# Patient Record
Sex: Male | Born: 1945 | Race: White | Hispanic: No | Marital: Married | Smoking: Never smoker
Health system: Southern US, Community
[De-identification: ages and names within clinical notes are randomized; demographics above are authoritative.]

## PROBLEM LIST (undated history)

## (undated) DIAGNOSIS — I10 Essential (primary) hypertension: Secondary | ICD-10-CM

## (undated) DIAGNOSIS — E782 Mixed hyperlipidemia: Secondary | ICD-10-CM

## (undated) DIAGNOSIS — G4730 Sleep apnea, unspecified: Secondary | ICD-10-CM

## (undated) DIAGNOSIS — E1142 Type 2 diabetes mellitus with diabetic polyneuropathy: Secondary | ICD-10-CM

## (undated) DIAGNOSIS — G4733 Obstructive sleep apnea (adult) (pediatric): Secondary | ICD-10-CM

## (undated) DIAGNOSIS — E119 Type 2 diabetes mellitus without complications: Secondary | ICD-10-CM

## (undated) DIAGNOSIS — E1122 Type 2 diabetes mellitus with diabetic chronic kidney disease: Secondary | ICD-10-CM

## (undated) HISTORY — PX: FOOT SURGERY: SHX648

## (undated) HISTORY — DX: Obstructive sleep apnea (adult) (pediatric): G47.33

## (undated) HISTORY — DX: Essential (primary) hypertension: I10

## (undated) HISTORY — DX: Type 2 diabetes mellitus with diabetic chronic kidney disease: E11.22

## (undated) HISTORY — PX: KNEE ARTHROPLASTY: SHX992

## (undated) HISTORY — PX: POLYPECTOMY: SHX149

## (undated) HISTORY — PX: SHOULDER ARTHROSCOPY W/ ROTATOR CUFF REPAIR: SHX2400

## (undated) HISTORY — DX: Sleep apnea, unspecified: G47.30

## (undated) HISTORY — DX: Type 2 diabetes mellitus without complications: E11.9

## (undated) HISTORY — PX: COLONOSCOPY: SHX174

## (undated) HISTORY — DX: Mixed hyperlipidemia: E78.2

## (undated) HISTORY — DX: Type 2 diabetes mellitus with diabetic polyneuropathy: E11.42

---

## 1998-02-01 ENCOUNTER — Ambulatory Visit (HOSPITAL_COMMUNITY): Admission: RE | Admit: 1998-02-01 | Discharge: 1998-02-01 | Payer: Self-pay | Admitting: Orthopedic Surgery

## 1998-02-01 ENCOUNTER — Encounter: Payer: Self-pay | Admitting: Orthopedic Surgery

## 1998-03-24 ENCOUNTER — Encounter: Payer: Self-pay | Admitting: Orthopedic Surgery

## 1998-03-30 ENCOUNTER — Inpatient Hospital Stay (HOSPITAL_COMMUNITY): Admission: EM | Admit: 1998-03-30 | Discharge: 1998-03-31 | Payer: Self-pay | Admitting: Orthopedic Surgery

## 1999-09-19 ENCOUNTER — Other Ambulatory Visit: Admission: RE | Admit: 1999-09-19 | Discharge: 1999-09-19 | Payer: Self-pay | Admitting: Gastroenterology

## 1999-09-19 ENCOUNTER — Encounter (INDEPENDENT_AMBULATORY_CARE_PROVIDER_SITE_OTHER): Payer: Self-pay | Admitting: Specialist

## 2008-06-11 ENCOUNTER — Encounter (INDEPENDENT_AMBULATORY_CARE_PROVIDER_SITE_OTHER): Payer: Self-pay | Admitting: *Deleted

## 2008-12-24 ENCOUNTER — Ambulatory Visit: Payer: Self-pay | Admitting: Gastroenterology

## 2009-01-06 ENCOUNTER — Ambulatory Visit: Payer: Self-pay | Admitting: Gastroenterology

## 2010-07-15 LAB — GLUCOSE, CAPILLARY
Glucose-Capillary: 165 mg/dL — ABNORMAL HIGH (ref 70–99)
Glucose-Capillary: 183 mg/dL — ABNORMAL HIGH (ref 70–99)

## 2013-09-19 ENCOUNTER — Other Ambulatory Visit: Payer: Self-pay | Admitting: Internal Medicine

## 2013-09-19 DIAGNOSIS — R221 Localized swelling, mass and lump, neck: Secondary | ICD-10-CM

## 2013-09-30 ENCOUNTER — Encounter (INDEPENDENT_AMBULATORY_CARE_PROVIDER_SITE_OTHER): Payer: Self-pay

## 2013-09-30 ENCOUNTER — Ambulatory Visit
Admission: RE | Admit: 2013-09-30 | Discharge: 2013-09-30 | Disposition: A | Discharge status: Home / Self Care | Payer: Medicare Other | Source: Ambulatory Visit | Attending: Internal Medicine | Admitting: Internal Medicine

## 2013-09-30 DIAGNOSIS — R221 Localized swelling, mass and lump, neck: Secondary | ICD-10-CM

## 2013-11-27 ENCOUNTER — Encounter: Payer: Self-pay | Admitting: Gastroenterology

## 2013-12-12 ENCOUNTER — Encounter: Payer: Self-pay | Admitting: Gastroenterology

## 2014-07-13 DIAGNOSIS — Z1211 Encounter for screening for malignant neoplasm of colon: Secondary | ICD-10-CM | POA: Diagnosis not present

## 2014-07-13 DIAGNOSIS — E119 Type 2 diabetes mellitus without complications: Secondary | ICD-10-CM | POA: Diagnosis not present

## 2014-07-13 DIAGNOSIS — Z0000 Encounter for general adult medical examination without abnormal findings: Secondary | ICD-10-CM | POA: Diagnosis not present

## 2014-07-14 DIAGNOSIS — E119 Type 2 diabetes mellitus without complications: Secondary | ICD-10-CM | POA: Diagnosis not present

## 2014-07-14 DIAGNOSIS — Z125 Encounter for screening for malignant neoplasm of prostate: Secondary | ICD-10-CM | POA: Diagnosis not present

## 2014-07-14 DIAGNOSIS — Z0000 Encounter for general adult medical examination without abnormal findings: Secondary | ICD-10-CM | POA: Diagnosis not present

## 2014-07-14 DIAGNOSIS — E782 Mixed hyperlipidemia: Secondary | ICD-10-CM | POA: Diagnosis not present

## 2014-07-14 DIAGNOSIS — I10 Essential (primary) hypertension: Secondary | ICD-10-CM | POA: Diagnosis not present

## 2014-07-22 DIAGNOSIS — I70213 Atherosclerosis of native arteries of extremities with intermittent claudication, bilateral legs: Secondary | ICD-10-CM | POA: Diagnosis not present

## 2014-07-22 DIAGNOSIS — N508 Other specified disorders of male genital organs: Secondary | ICD-10-CM | POA: Diagnosis not present

## 2014-07-22 DIAGNOSIS — I7389 Other specified peripheral vascular diseases: Secondary | ICD-10-CM | POA: Diagnosis not present

## 2014-07-22 DIAGNOSIS — N433 Hydrocele, unspecified: Secondary | ICD-10-CM | POA: Diagnosis not present

## 2014-07-22 DIAGNOSIS — I7090 Unspecified atherosclerosis: Secondary | ICD-10-CM | POA: Diagnosis not present

## 2014-07-31 DIAGNOSIS — H524 Presbyopia: Secondary | ICD-10-CM | POA: Diagnosis not present

## 2014-07-31 DIAGNOSIS — H5213 Myopia, bilateral: Secondary | ICD-10-CM | POA: Diagnosis not present

## 2014-07-31 DIAGNOSIS — H25813 Combined forms of age-related cataract, bilateral: Secondary | ICD-10-CM | POA: Diagnosis not present

## 2014-07-31 DIAGNOSIS — H52223 Regular astigmatism, bilateral: Secondary | ICD-10-CM | POA: Diagnosis not present

## 2014-07-31 DIAGNOSIS — E119 Type 2 diabetes mellitus without complications: Secondary | ICD-10-CM | POA: Diagnosis not present

## 2014-07-31 DIAGNOSIS — Z794 Long term (current) use of insulin: Secondary | ICD-10-CM | POA: Diagnosis not present

## 2014-09-28 DIAGNOSIS — Z794 Long term (current) use of insulin: Secondary | ICD-10-CM | POA: Diagnosis not present

## 2014-09-28 DIAGNOSIS — E1142 Type 2 diabetes mellitus with diabetic polyneuropathy: Secondary | ICD-10-CM | POA: Diagnosis not present

## 2014-09-30 DIAGNOSIS — E1142 Type 2 diabetes mellitus with diabetic polyneuropathy: Secondary | ICD-10-CM | POA: Diagnosis not present

## 2014-09-30 DIAGNOSIS — Z794 Long term (current) use of insulin: Secondary | ICD-10-CM | POA: Diagnosis not present

## 2014-09-30 DIAGNOSIS — N182 Chronic kidney disease, stage 2 (mild): Secondary | ICD-10-CM | POA: Diagnosis not present

## 2014-09-30 DIAGNOSIS — Z23 Encounter for immunization: Secondary | ICD-10-CM | POA: Diagnosis not present

## 2014-09-30 DIAGNOSIS — E1122 Type 2 diabetes mellitus with diabetic chronic kidney disease: Secondary | ICD-10-CM | POA: Diagnosis not present

## 2014-11-02 ENCOUNTER — Ambulatory Visit (INDEPENDENT_AMBULATORY_CARE_PROVIDER_SITE_OTHER): Payer: Commercial Managed Care - HMO | Admitting: Podiatry

## 2014-11-02 ENCOUNTER — Encounter: Payer: Self-pay | Admitting: Podiatry

## 2014-11-02 VITALS — BP 139/66 | HR 71 | Resp 18

## 2014-11-02 DIAGNOSIS — B351 Tinea unguium: Secondary | ICD-10-CM | POA: Diagnosis not present

## 2014-11-02 DIAGNOSIS — E119 Type 2 diabetes mellitus without complications: Secondary | ICD-10-CM

## 2014-11-02 DIAGNOSIS — M79676 Pain in unspecified toe(s): Secondary | ICD-10-CM | POA: Diagnosis not present

## 2014-11-02 NOTE — Progress Notes (Signed)
   Subjective:    Patient ID: Johnathan Wall, male    DOB: 06-01-1945, 69 y.o.   MRN: [id number]  HPI I HAVE DIABETES AND I NEED MY FEET CHECKED AND I DO HAVE A CYST THAT IS ON THE TOP OF MY LEFT FOOT AND I DID HAVE SURGERY ON IT YEARS AGO This patient presents to my office for evaluation of his diabetic feet.  He says his nails need to be checked.  He has been diagnosed with type 2 diabetes.  He desires to be evaluated for footwear.   Review of Systems  Musculoskeletal: Positive for back pain.       MUSCLE PAIN   All other systems reviewed and are negative.      Objective:   Physical Exam GENERAL APPEARANCE: Alert, conversant. Appropriately groomed. No acute distress.  VASCULAR: Pedal pulses palpable at 2/4 DP and PT bilateral.  Capillary refill time is immediate to all digits,  Proximal to distal cooling it warm to warm.  Digital hair growth is present bilateral  NEUROLOGIC: sensation is intact epicritically and protectively to 5.07 monofilament at 5/5 sites bilateral.  Light touch is intact bilateral, vibratory sensation intact bilateral, achilles tendon reflex is intact bilateral.  MUSCULOSKELETAL: acceptable muscle strength, tone and stability bilateral.  Intrinsic muscluature intact bilateral.  Rectus appearance of foot and digits noted bilateral.   DERMATOLOGIC: skin color, texture, and turgor are within normal limits.  No preulcerative lesions or ulcers  are seen, no interdigital maceration noted.  No open lesions present.  Digital nails are thick and disfigured big toes both feet.. No drainage noted. Blisters form along tight plantar fascia B/L.           Assessment & Plan:  Onychomycosis   Debride nails.  Discussed orthotic usage.  His diabetic foot exam were all found to be WNL therefore I believe he does not need diabetic shoes.

## 2015-01-11 DIAGNOSIS — Z23 Encounter for immunization: Secondary | ICD-10-CM | POA: Diagnosis not present

## 2015-02-15 ENCOUNTER — Encounter: Payer: Self-pay | Admitting: Podiatry

## 2015-02-15 ENCOUNTER — Ambulatory Visit (INDEPENDENT_AMBULATORY_CARE_PROVIDER_SITE_OTHER): Payer: Commercial Managed Care - HMO | Admitting: Podiatry

## 2015-02-15 ENCOUNTER — Ambulatory Visit (INDEPENDENT_AMBULATORY_CARE_PROVIDER_SITE_OTHER): Payer: Commercial Managed Care - HMO

## 2015-02-15 VITALS — BP 158/71 | HR 89 | Resp 14

## 2015-02-15 DIAGNOSIS — R52 Pain, unspecified: Secondary | ICD-10-CM

## 2015-02-15 DIAGNOSIS — E1140 Type 2 diabetes mellitus with diabetic neuropathy, unspecified: Secondary | ICD-10-CM

## 2015-02-15 DIAGNOSIS — M722 Plantar fascial fibromatosis: Secondary | ICD-10-CM | POA: Diagnosis not present

## 2015-02-15 MED ORDER — MELOXICAM 15 MG PO TABS: 15.0000 mg | ORAL_TABLET | Freq: Every day | ORAL | Status: DC

## 2015-02-15 NOTE — Progress Notes (Signed)
Subjective:     Patient ID: Johnathan Wall, male   DOB: 1945-04-25, 69 y.o.   MRN: [id number]  HPIThis patient presents to the office with severe pain in his left heel.  He says he developed severe pain about three weeks ago.  No trauma.  He says he used a brace for the last 2 weeks and his foot has improved and his pain lessened.  He says the pain is still 6 out of 10. He has pain upon rising in the morning and standing from sitting position.  Pain is severe by end of the day.  He desires evaluation and treatment.   Review of Systems     Objective:   Physical Exam GENERAL APPEARANCE: Alert, conversant. Appropriately groomed. No acute distress.  VASCULAR: Pedal pulses palpable at  [facility] and PT bilateral.  Capillary refill time is immediate to all digits,  Normal temperature gradient.  Digital hair growth is present bilateral  NEUROLOGIC: sensation is normal to 5.07 monofilament at 5/5 sites bilateral.  Light touch is intact bilateral, Muscle strength normal.  MUSCULOSKELETAL: acceptable muscle strength, tone and stability bilateral.  Intrinsic muscluature intact bilateral.  Rectus appearance of foot and digits noted bilateral. Palpable pain at the insertion plantar fascia left foot. Pain along course plantar fascia. HAV 1st MPJ left foot.  DERMATOLOGIC: skin color, texture, and turgor are within normal limits.  No preulcerative lesions or ulcers  are seen, no interdigital maceration noted.  No open lesions present.  Digital nails are asymptomatic. No drainage noted.      Assessment:     Plantar fascitis left foot.  Diabetes with neuropathy.     Plan:     ROV>  X-rays reveal calcification at the insertion plantar fascia B/L. Recommended stretching exercises.  To use ice and heat.  Prescribe Mobic.  Recommend powersteps.    Injection therapy.  RTC 2 weeks. Initiate diabetic shoe paperwork.

## 2015-02-15 NOTE — Addendum Note (Signed)
Addended by: Harriett Sine D on: 02/15/2015 01:13 PM   Modules accepted: Orders

## 2015-03-01 ENCOUNTER — Encounter: Payer: Self-pay | Admitting: Podiatry

## 2015-03-01 ENCOUNTER — Ambulatory Visit (INDEPENDENT_AMBULATORY_CARE_PROVIDER_SITE_OTHER): Payer: Commercial Managed Care - HMO | Admitting: Podiatry

## 2015-03-01 VITALS — BP 181/84 | HR 86 | Resp 14

## 2015-03-01 DIAGNOSIS — M722 Plantar fascial fibromatosis: Secondary | ICD-10-CM

## 2015-03-01 NOTE — Progress Notes (Signed)
Subjective:     Patient ID: Johnathan Wall, male   DOB: 1945/06/22, 69 y.o.   MRN: [id number]  HPIThpatient returns to the office with marked improvement since his last visit.  He was diagnosed with plantar faciitis  which was treated with injection therapy Mobic and powersteps.  He says he is 80% imptoved.  Pain persists though at back of left heel at this time.   Review of Systems     Objective:   Physical Exam Physical Exam GENERAL APPEARANCE: Alert, conversant. Appropriately groomed. No acute distress.  VASCULAR: Pedal pulses palpable at [facility] and PT bilateral. Capillary refill time is immediate to all digits, Normal temperature gradient. Digital hair growth is present bilateral  NEUROLOGIC: sensation is normal to 5.07 monofilament at 5/5 sites bilateral. Light touch is intact bilateral, Muscle strength normal.  MUSCULOSKELETAL: acceptable muscle strength, tone and stability bilateral. Intrinsic muscluature intact bilateral. Rectus appearance of foot and digits noted bilateral. No pain at insertion plantar fascia insertion.  Pain and burning noted at achilles tendon insertion left foot.  DERMATOLOGIC: skin color, texture, and turgor are within normal limits. No preulcerative lesions or ulcers are seen, no interdigital maceration noted. No open lesions present. Digital nails are asymptomatic. No drainage noted    Assessment:     Plantar fasciitis left heel  Achilles tendinitis     Plan:     ROV  Continue on medicine and wearing powersteps.  He is scheduled to receive diabetic shoes this month.

## 2015-04-05 ENCOUNTER — Ambulatory Visit: Payer: Commercial Managed Care - HMO | Admitting: *Deleted

## 2015-04-05 DIAGNOSIS — E1140 Type 2 diabetes mellitus with diabetic neuropathy, unspecified: Secondary | ICD-10-CM

## 2015-04-05 NOTE — Progress Notes (Signed)
Patient ID: Johnathan Wall, male   DOB: 1946-01-08, 69 y.o.   MRN: [id number] Patient presents to be scanned and measured for diabetic shoes and inserts.

## 2015-04-14 DIAGNOSIS — E1122 Type 2 diabetes mellitus with diabetic chronic kidney disease: Secondary | ICD-10-CM | POA: Diagnosis not present

## 2015-04-14 DIAGNOSIS — Z794 Long term (current) use of insulin: Secondary | ICD-10-CM | POA: Diagnosis not present

## 2015-04-14 DIAGNOSIS — E1142 Type 2 diabetes mellitus with diabetic polyneuropathy: Secondary | ICD-10-CM | POA: Diagnosis not present

## 2015-04-14 DIAGNOSIS — N182 Chronic kidney disease, stage 2 (mild): Secondary | ICD-10-CM | POA: Diagnosis not present

## 2015-05-19 ENCOUNTER — Encounter: Payer: Commercial Managed Care - HMO | Admitting: *Deleted

## 2015-06-08 ENCOUNTER — Other Ambulatory Visit: Payer: Commercial Managed Care - HMO

## 2015-06-16 ENCOUNTER — Other Ambulatory Visit: Payer: Commercial Managed Care - HMO

## 2015-07-05 ENCOUNTER — Ambulatory Visit (INDEPENDENT_AMBULATORY_CARE_PROVIDER_SITE_OTHER): Payer: Commercial Managed Care - HMO | Admitting: Podiatry

## 2015-07-05 ENCOUNTER — Encounter: Payer: Self-pay | Admitting: Podiatry

## 2015-07-05 VITALS — BP 147/83 | HR 92 | Resp 14

## 2015-07-05 DIAGNOSIS — M722 Plantar fascial fibromatosis: Secondary | ICD-10-CM

## 2015-07-05 DIAGNOSIS — E1140 Type 2 diabetes mellitus with diabetic neuropathy, unspecified: Secondary | ICD-10-CM

## 2015-07-05 NOTE — Progress Notes (Signed)
Subjective:     Patient ID: Johnathan Wall, male   DOB: 1946/03/31, 70 y.o.   MRN: [id number]  HPI this patient returns to the office for an evaluation of his foot as well as his diabetic shoes that were dispensed in December. This patient presents the office stating that he is in no pain or discomfort ever since the diabetic insoles were contoured and fit to the shoe. He is very pleased with his progress   Review of Systems     Objective:   Physical Exam GENERAL APPEARANCE: Alert, conversant. Appropriately groomed. No acute distress.  VASCULAR: Pedal pulses are  palpable at  [facility] and PT bilateral.  Capillary refill time is immediate to all digits,  Normal temperature gradient.  Digital hair growth is present bilateral  NEUROLOGIC: sensation is normal to 5.07 monofilament at 5/5 sites bilateral.  Light touch is intact bilateral, Muscle strength normal.  MUSCULOSKELETAL  A cceptable muscle strength, tone and stability bilateral.  Intrinsic muscluature intact bilateral.  Rectus appearance of foot and digits noted bilateral. No pain in heel or achilles region.   DERMATOLOGIC: skin color, texture, and turgor are within normal limits.  No preulcerative lesions or ulcers  are seen, no interdigital maceration noted.  No open lesions present.  Digital nails are asymptomatic. No drainage noted.      Assessment:     Plantar Facitis  Achilles tendinitis  Diabetes     Plan:     ROV.   Discussed his footwear.  Told to RTC prn.  Gardiner Barefoot DPM

## 2015-07-14 DIAGNOSIS — N200 Calculus of kidney: Secondary | ICD-10-CM | POA: Diagnosis not present

## 2015-07-14 DIAGNOSIS — R1031 Right lower quadrant pain: Secondary | ICD-10-CM | POA: Diagnosis not present

## 2015-07-14 DIAGNOSIS — R509 Fever, unspecified: Secondary | ICD-10-CM | POA: Diagnosis not present

## 2015-07-14 DIAGNOSIS — R319 Hematuria, unspecified: Secondary | ICD-10-CM | POA: Diagnosis not present

## 2015-07-14 DIAGNOSIS — N390 Urinary tract infection, site not specified: Secondary | ICD-10-CM | POA: Diagnosis not present

## 2015-08-15 IMAGING — US US SOFT TISSUE HEAD/NECK
1 series · 10 of 10 positions shown · non-contrast
Comparison: None.

CLINICAL DATA: Palpable area in the posterior right neck

EXAM:
ULTRASOUND OF HEAD/NECK SOFT TISSUES
TECHNIQUE: Ultrasound examination of the head and neck soft tissues was
performed in the area of clinical concern.

[Series 1: us soft tissue head/neck · 0.05mm/px · 10 of 10 slices shown]
[im 1/10]
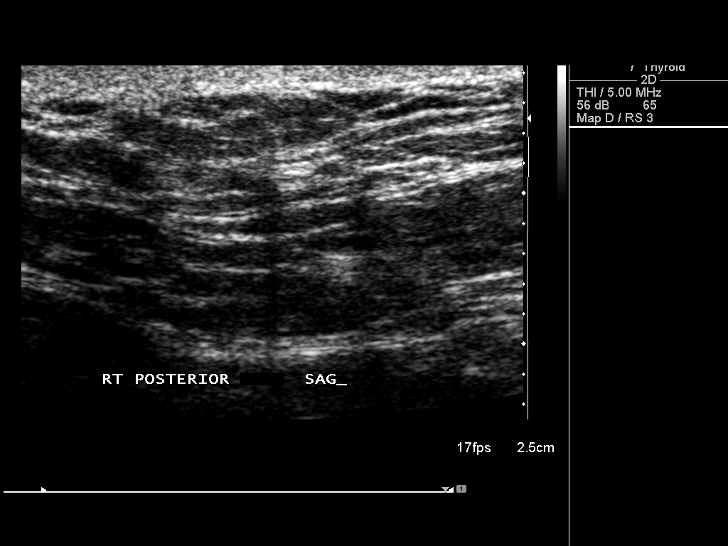
[im 2/10]
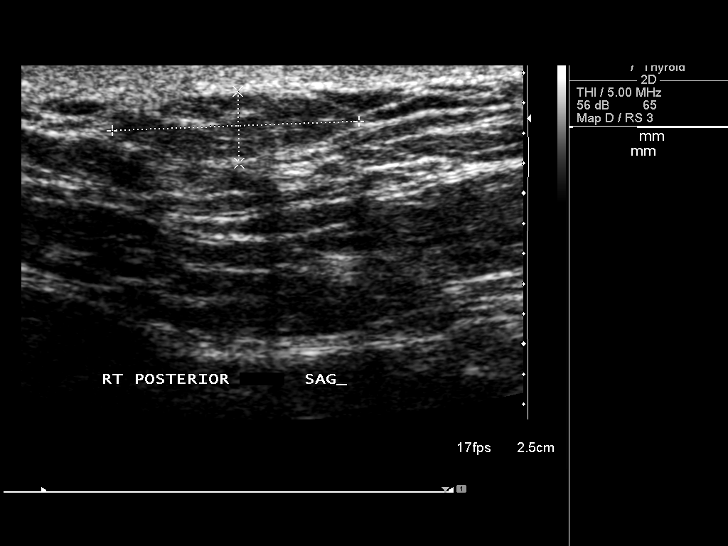
[im 3/10]
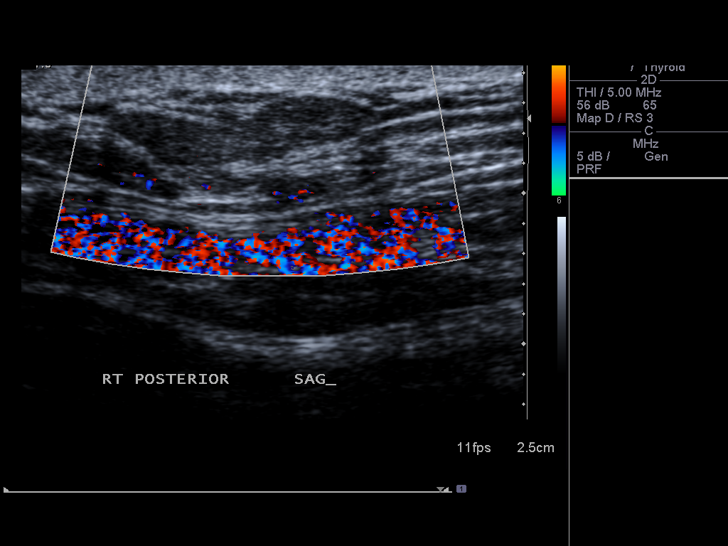
[im 4/10]
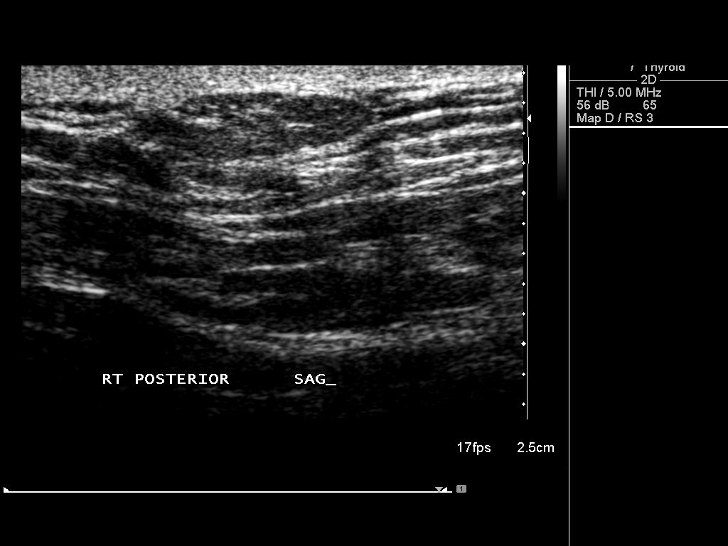
[im 5/10]
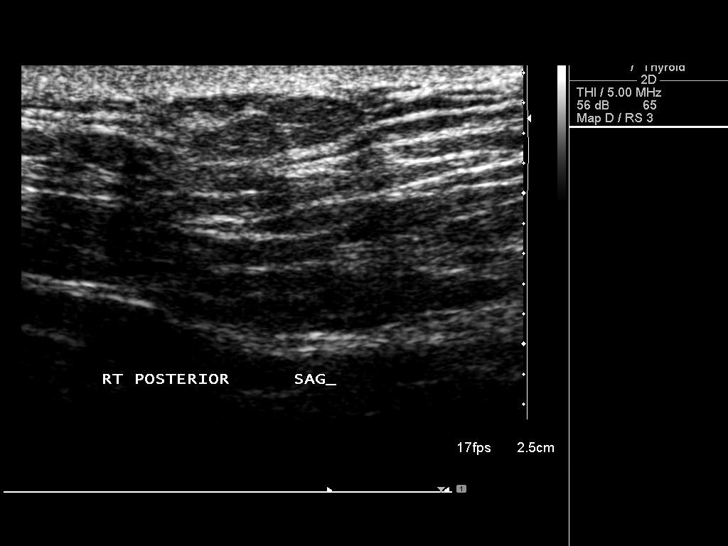
[im 6/10]
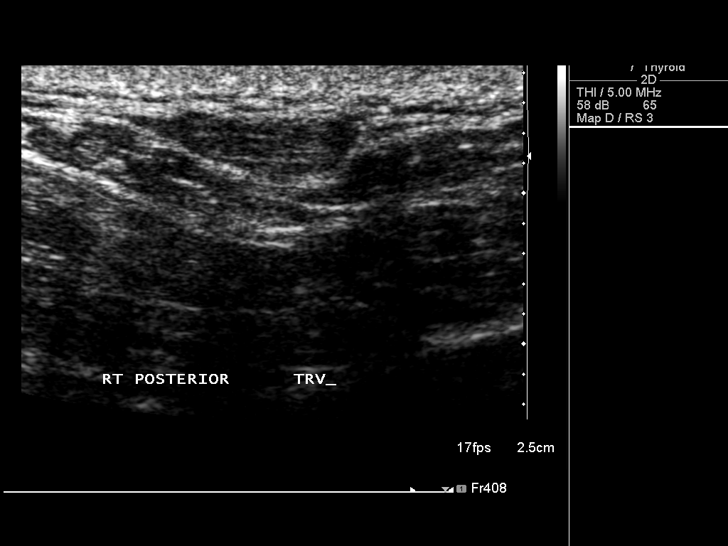
[im 7/10]
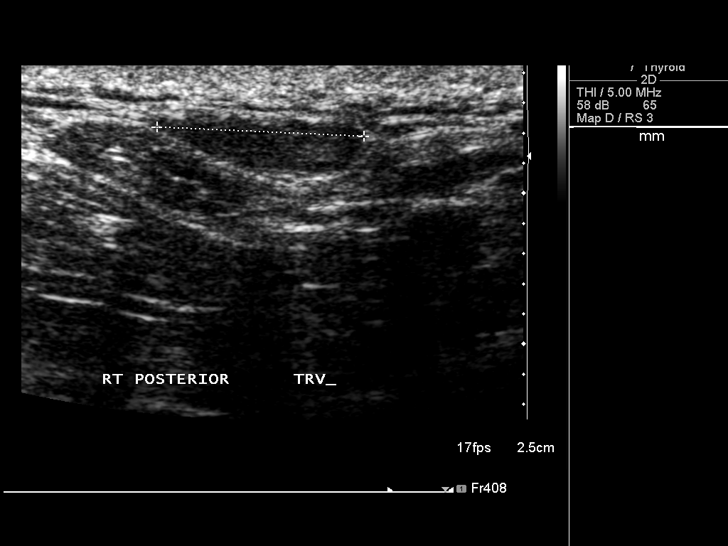
[im 8/10]
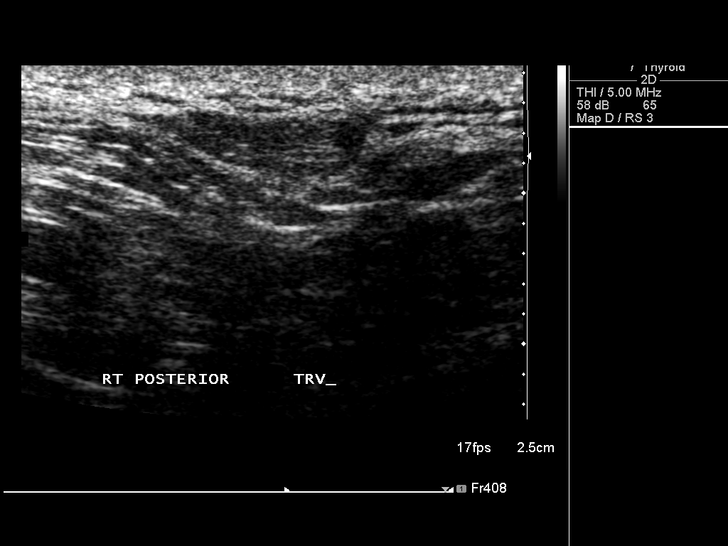
[im 9/10]
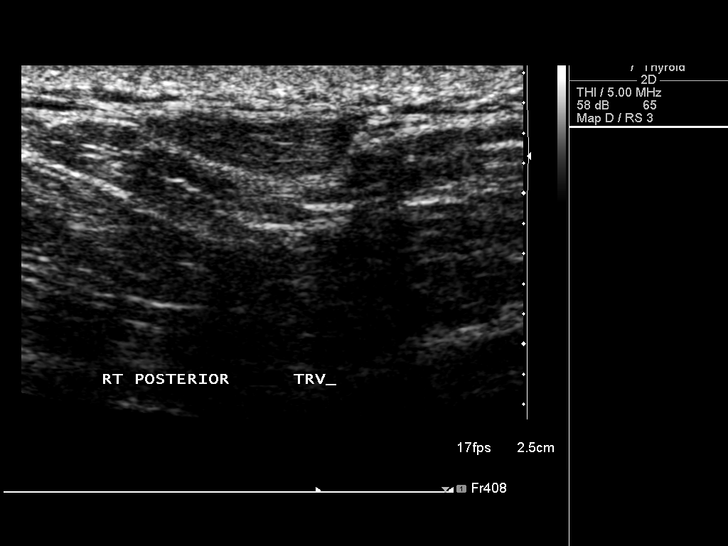
[im 10/10]
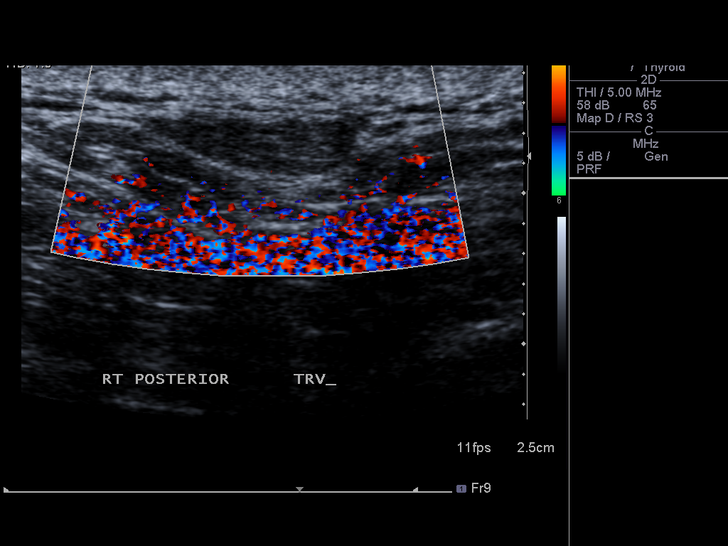

[10 of 10 positions shown; findings below may reference images not displayed]

FINDINGS: Ultrasound over the area in question was performed. The palpable
area corresponds to an oval soft tissue structure with echogenicity
centrally consistent with lymph node of 1.6 x 0.5 x 1.4 cm. No
abnormally enlarged lymph nodes are noted.
IMPRESSION: The area in question corresponds to a normal appearing lymph node by
ultrasound.

## 2015-10-19 DIAGNOSIS — Z794 Long term (current) use of insulin: Secondary | ICD-10-CM | POA: Diagnosis not present

## 2015-10-19 DIAGNOSIS — E1122 Type 2 diabetes mellitus with diabetic chronic kidney disease: Secondary | ICD-10-CM | POA: Diagnosis not present

## 2015-10-19 DIAGNOSIS — N182 Chronic kidney disease, stage 2 (mild): Secondary | ICD-10-CM | POA: Diagnosis not present

## 2015-10-19 DIAGNOSIS — Z5181 Encounter for therapeutic drug level monitoring: Secondary | ICD-10-CM | POA: Diagnosis not present

## 2015-10-19 DIAGNOSIS — E1142 Type 2 diabetes mellitus with diabetic polyneuropathy: Secondary | ICD-10-CM | POA: Diagnosis not present

## 2015-11-01 ENCOUNTER — Ambulatory Visit: Payer: Medicare HMO | Admitting: Podiatry

## 2015-11-01 DIAGNOSIS — Z7984 Long term (current) use of oral hypoglycemic drugs: Secondary | ICD-10-CM | POA: Diagnosis not present

## 2015-11-01 DIAGNOSIS — Z794 Long term (current) use of insulin: Secondary | ICD-10-CM | POA: Diagnosis not present

## 2015-11-01 DIAGNOSIS — E1142 Type 2 diabetes mellitus with diabetic polyneuropathy: Secondary | ICD-10-CM | POA: Diagnosis not present

## 2015-11-01 DIAGNOSIS — Z79899 Other long term (current) drug therapy: Secondary | ICD-10-CM | POA: Diagnosis not present

## 2015-11-08 DIAGNOSIS — Z794 Long term (current) use of insulin: Secondary | ICD-10-CM | POA: Diagnosis not present

## 2015-11-08 DIAGNOSIS — E1142 Type 2 diabetes mellitus with diabetic polyneuropathy: Secondary | ICD-10-CM | POA: Diagnosis not present

## 2015-11-19 DIAGNOSIS — E538 Deficiency of other specified B group vitamins: Secondary | ICD-10-CM | POA: Diagnosis not present

## 2015-11-26 DIAGNOSIS — E538 Deficiency of other specified B group vitamins: Secondary | ICD-10-CM | POA: Diagnosis not present

## 2015-12-03 DIAGNOSIS — E538 Deficiency of other specified B group vitamins: Secondary | ICD-10-CM | POA: Diagnosis not present

## 2015-12-06 ENCOUNTER — Other Ambulatory Visit: Payer: Self-pay | Admitting: Internal Medicine

## 2015-12-06 DIAGNOSIS — R6889 Other general symptoms and signs: Secondary | ICD-10-CM

## 2015-12-14 ENCOUNTER — Ambulatory Visit
Admission: RE | Admit: 2015-12-14 | Discharge: 2015-12-14 | Disposition: A | Discharge status: Home / Self Care | Payer: Commercial Managed Care - HMO | Source: Ambulatory Visit | Attending: Internal Medicine | Admitting: Internal Medicine

## 2015-12-14 DIAGNOSIS — M79604 Pain in right leg: Secondary | ICD-10-CM | POA: Diagnosis not present

## 2015-12-14 DIAGNOSIS — R6889 Other general symptoms and signs: Secondary | ICD-10-CM

## 2016-01-04 DIAGNOSIS — Z23 Encounter for immunization: Secondary | ICD-10-CM | POA: Diagnosis not present

## 2016-01-04 DIAGNOSIS — E538 Deficiency of other specified B group vitamins: Secondary | ICD-10-CM | POA: Diagnosis not present

## 2016-01-26 DIAGNOSIS — G4733 Obstructive sleep apnea (adult) (pediatric): Secondary | ICD-10-CM | POA: Diagnosis not present

## 2016-01-26 DIAGNOSIS — Z125 Encounter for screening for malignant neoplasm of prostate: Secondary | ICD-10-CM | POA: Diagnosis not present

## 2016-01-27 DIAGNOSIS — G4733 Obstructive sleep apnea (adult) (pediatric): Secondary | ICD-10-CM | POA: Diagnosis not present

## 2016-01-31 DIAGNOSIS — G4733 Obstructive sleep apnea (adult) (pediatric): Secondary | ICD-10-CM | POA: Diagnosis not present

## 2016-02-04 DIAGNOSIS — E1122 Type 2 diabetes mellitus with diabetic chronic kidney disease: Secondary | ICD-10-CM | POA: Diagnosis not present

## 2016-02-04 DIAGNOSIS — N182 Chronic kidney disease, stage 2 (mild): Secondary | ICD-10-CM | POA: Diagnosis not present

## 2016-02-04 DIAGNOSIS — Z5181 Encounter for therapeutic drug level monitoring: Secondary | ICD-10-CM | POA: Diagnosis not present

## 2016-02-04 DIAGNOSIS — E1142 Type 2 diabetes mellitus with diabetic polyneuropathy: Secondary | ICD-10-CM | POA: Diagnosis not present

## 2016-02-04 DIAGNOSIS — E538 Deficiency of other specified B group vitamins: Secondary | ICD-10-CM | POA: Diagnosis not present

## 2016-02-04 DIAGNOSIS — Z794 Long term (current) use of insulin: Secondary | ICD-10-CM | POA: Diagnosis not present

## 2016-03-07 DIAGNOSIS — Z794 Long term (current) use of insulin: Secondary | ICD-10-CM | POA: Diagnosis not present

## 2016-03-07 DIAGNOSIS — E1142 Type 2 diabetes mellitus with diabetic polyneuropathy: Secondary | ICD-10-CM | POA: Diagnosis not present

## 2016-04-14 DIAGNOSIS — E1122 Type 2 diabetes mellitus with diabetic chronic kidney disease: Secondary | ICD-10-CM | POA: Diagnosis not present

## 2016-04-14 DIAGNOSIS — E1142 Type 2 diabetes mellitus with diabetic polyneuropathy: Secondary | ICD-10-CM | POA: Diagnosis not present

## 2016-04-14 DIAGNOSIS — Z5181 Encounter for therapeutic drug level monitoring: Secondary | ICD-10-CM | POA: Diagnosis not present

## 2016-04-14 DIAGNOSIS — E669 Obesity, unspecified: Secondary | ICD-10-CM | POA: Diagnosis not present

## 2016-04-14 DIAGNOSIS — Z794 Long term (current) use of insulin: Secondary | ICD-10-CM | POA: Diagnosis not present

## 2016-04-14 DIAGNOSIS — Z6839 Body mass index (BMI) 39.0-39.9, adult: Secondary | ICD-10-CM | POA: Diagnosis not present

## 2016-04-14 DIAGNOSIS — N182 Chronic kidney disease, stage 2 (mild): Secondary | ICD-10-CM | POA: Diagnosis not present

## 2016-04-14 DIAGNOSIS — E538 Deficiency of other specified B group vitamins: Secondary | ICD-10-CM | POA: Diagnosis not present

## 2016-05-03 DIAGNOSIS — G4733 Obstructive sleep apnea (adult) (pediatric): Secondary | ICD-10-CM | POA: Diagnosis not present

## 2016-05-17 DIAGNOSIS — E538 Deficiency of other specified B group vitamins: Secondary | ICD-10-CM | POA: Diagnosis not present

## 2016-05-23 DIAGNOSIS — E109 Type 1 diabetes mellitus without complications: Secondary | ICD-10-CM | POA: Diagnosis not present

## 2016-06-15 DIAGNOSIS — E538 Deficiency of other specified B group vitamins: Secondary | ICD-10-CM | POA: Diagnosis not present

## 2016-06-22 DIAGNOSIS — M25561 Pain in right knee: Secondary | ICD-10-CM | POA: Diagnosis not present

## 2016-06-22 DIAGNOSIS — M25512 Pain in left shoulder: Secondary | ICD-10-CM | POA: Diagnosis not present

## 2016-06-22 DIAGNOSIS — G8929 Other chronic pain: Secondary | ICD-10-CM | POA: Diagnosis not present

## 2016-06-26 DIAGNOSIS — M24112 Other articular cartilage disorders, left shoulder: Secondary | ICD-10-CM | POA: Diagnosis not present

## 2016-06-26 DIAGNOSIS — M19012 Primary osteoarthritis, left shoulder: Secondary | ICD-10-CM | POA: Diagnosis not present

## 2016-06-26 DIAGNOSIS — M24012 Loose body in left shoulder: Secondary | ICD-10-CM | POA: Diagnosis not present

## 2016-06-26 DIAGNOSIS — M94212 Chondromalacia, left shoulder: Secondary | ICD-10-CM | POA: Diagnosis not present

## 2016-06-26 DIAGNOSIS — M25412 Effusion, left shoulder: Secondary | ICD-10-CM | POA: Diagnosis not present

## 2016-06-26 DIAGNOSIS — M75122 Complete rotator cuff tear or rupture of left shoulder, not specified as traumatic: Secondary | ICD-10-CM | POA: Diagnosis not present

## 2016-07-01 DIAGNOSIS — M25561 Pain in right knee: Secondary | ICD-10-CM | POA: Diagnosis not present

## 2016-07-01 DIAGNOSIS — G8929 Other chronic pain: Secondary | ICD-10-CM | POA: Diagnosis not present

## 2016-07-01 DIAGNOSIS — M25512 Pain in left shoulder: Secondary | ICD-10-CM | POA: Diagnosis not present

## 2016-07-06 DIAGNOSIS — M25512 Pain in left shoulder: Secondary | ICD-10-CM | POA: Diagnosis not present

## 2016-07-17 DIAGNOSIS — E538 Deficiency of other specified B group vitamins: Secondary | ICD-10-CM | POA: Diagnosis not present

## 2016-07-27 DIAGNOSIS — M7552 Bursitis of left shoulder: Secondary | ICD-10-CM | POA: Diagnosis not present

## 2016-07-27 DIAGNOSIS — R69 Illness, unspecified: Secondary | ICD-10-CM | POA: Diagnosis not present

## 2016-07-27 DIAGNOSIS — G8918 Other acute postprocedural pain: Secondary | ICD-10-CM | POA: Diagnosis not present

## 2016-07-27 DIAGNOSIS — M7542 Impingement syndrome of left shoulder: Secondary | ICD-10-CM | POA: Diagnosis not present

## 2016-07-27 DIAGNOSIS — M19012 Primary osteoarthritis, left shoulder: Secondary | ICD-10-CM | POA: Diagnosis not present

## 2016-07-27 DIAGNOSIS — M75122 Complete rotator cuff tear or rupture of left shoulder, not specified as traumatic: Secondary | ICD-10-CM | POA: Diagnosis not present

## 2016-09-05 DIAGNOSIS — M25612 Stiffness of left shoulder, not elsewhere classified: Secondary | ICD-10-CM | POA: Diagnosis not present

## 2016-09-08 DIAGNOSIS — M25612 Stiffness of left shoulder, not elsewhere classified: Secondary | ICD-10-CM | POA: Diagnosis not present

## 2016-09-11 DIAGNOSIS — E1142 Type 2 diabetes mellitus with diabetic polyneuropathy: Secondary | ICD-10-CM | POA: Diagnosis not present

## 2016-09-11 DIAGNOSIS — E1122 Type 2 diabetes mellitus with diabetic chronic kidney disease: Secondary | ICD-10-CM | POA: Diagnosis not present

## 2016-09-11 DIAGNOSIS — E538 Deficiency of other specified B group vitamins: Secondary | ICD-10-CM | POA: Diagnosis not present

## 2016-09-11 DIAGNOSIS — E669 Obesity, unspecified: Secondary | ICD-10-CM | POA: Diagnosis not present

## 2016-09-11 DIAGNOSIS — N182 Chronic kidney disease, stage 2 (mild): Secondary | ICD-10-CM | POA: Diagnosis not present

## 2016-09-11 DIAGNOSIS — Z794 Long term (current) use of insulin: Secondary | ICD-10-CM | POA: Diagnosis not present

## 2016-09-11 DIAGNOSIS — Z6839 Body mass index (BMI) 39.0-39.9, adult: Secondary | ICD-10-CM | POA: Diagnosis not present

## 2016-09-11 DIAGNOSIS — Z5181 Encounter for therapeutic drug level monitoring: Secondary | ICD-10-CM | POA: Diagnosis not present

## 2016-09-15 DIAGNOSIS — M25612 Stiffness of left shoulder, not elsewhere classified: Secondary | ICD-10-CM | POA: Diagnosis not present

## 2016-09-19 DIAGNOSIS — M25612 Stiffness of left shoulder, not elsewhere classified: Secondary | ICD-10-CM | POA: Diagnosis not present

## 2016-09-22 DIAGNOSIS — M25612 Stiffness of left shoulder, not elsewhere classified: Secondary | ICD-10-CM | POA: Diagnosis not present

## 2016-09-26 DIAGNOSIS — M25612 Stiffness of left shoulder, not elsewhere classified: Secondary | ICD-10-CM | POA: Diagnosis not present

## 2016-09-29 DIAGNOSIS — M25612 Stiffness of left shoulder, not elsewhere classified: Secondary | ICD-10-CM | POA: Diagnosis not present

## 2016-10-03 DIAGNOSIS — M25612 Stiffness of left shoulder, not elsewhere classified: Secondary | ICD-10-CM | POA: Diagnosis not present

## 2016-10-06 DIAGNOSIS — M25612 Stiffness of left shoulder, not elsewhere classified: Secondary | ICD-10-CM | POA: Diagnosis not present

## 2016-10-17 DIAGNOSIS — M25612 Stiffness of left shoulder, not elsewhere classified: Secondary | ICD-10-CM | POA: Diagnosis not present

## 2016-10-20 DIAGNOSIS — M25612 Stiffness of left shoulder, not elsewhere classified: Secondary | ICD-10-CM | POA: Diagnosis not present

## 2016-10-20 DIAGNOSIS — E538 Deficiency of other specified B group vitamins: Secondary | ICD-10-CM | POA: Diagnosis not present

## 2016-10-24 DIAGNOSIS — M25612 Stiffness of left shoulder, not elsewhere classified: Secondary | ICD-10-CM | POA: Diagnosis not present

## 2016-10-27 DIAGNOSIS — M25612 Stiffness of left shoulder, not elsewhere classified: Secondary | ICD-10-CM | POA: Diagnosis not present

## 2016-10-30 DIAGNOSIS — M25612 Stiffness of left shoulder, not elsewhere classified: Secondary | ICD-10-CM | POA: Diagnosis not present

## 2016-11-02 DIAGNOSIS — M25612 Stiffness of left shoulder, not elsewhere classified: Secondary | ICD-10-CM | POA: Diagnosis not present

## 2016-11-06 DIAGNOSIS — M25612 Stiffness of left shoulder, not elsewhere classified: Secondary | ICD-10-CM | POA: Diagnosis not present

## 2016-11-14 DIAGNOSIS — M25612 Stiffness of left shoulder, not elsewhere classified: Secondary | ICD-10-CM | POA: Diagnosis not present

## 2016-11-17 DIAGNOSIS — M25612 Stiffness of left shoulder, not elsewhere classified: Secondary | ICD-10-CM | POA: Diagnosis not present

## 2016-11-17 DIAGNOSIS — G4733 Obstructive sleep apnea (adult) (pediatric): Secondary | ICD-10-CM | POA: Diagnosis not present

## 2016-11-21 DIAGNOSIS — M25612 Stiffness of left shoulder, not elsewhere classified: Secondary | ICD-10-CM | POA: Diagnosis not present

## 2016-11-22 DIAGNOSIS — G4733 Obstructive sleep apnea (adult) (pediatric): Secondary | ICD-10-CM | POA: Diagnosis not present

## 2016-11-24 DIAGNOSIS — M25612 Stiffness of left shoulder, not elsewhere classified: Secondary | ICD-10-CM | POA: Diagnosis not present

## 2016-12-01 DIAGNOSIS — M25612 Stiffness of left shoulder, not elsewhere classified: Secondary | ICD-10-CM | POA: Diagnosis not present

## 2016-12-05 DIAGNOSIS — Z4789 Encounter for other orthopedic aftercare: Secondary | ICD-10-CM | POA: Diagnosis not present

## 2016-12-21 DIAGNOSIS — E669 Obesity, unspecified: Secondary | ICD-10-CM | POA: Diagnosis not present

## 2016-12-21 DIAGNOSIS — E1142 Type 2 diabetes mellitus with diabetic polyneuropathy: Secondary | ICD-10-CM | POA: Diagnosis not present

## 2016-12-21 DIAGNOSIS — Z5181 Encounter for therapeutic drug level monitoring: Secondary | ICD-10-CM | POA: Diagnosis not present

## 2016-12-21 DIAGNOSIS — E1122 Type 2 diabetes mellitus with diabetic chronic kidney disease: Secondary | ICD-10-CM | POA: Diagnosis not present

## 2016-12-21 DIAGNOSIS — Z794 Long term (current) use of insulin: Secondary | ICD-10-CM | POA: Diagnosis not present

## 2016-12-21 DIAGNOSIS — Z6841 Body Mass Index (BMI) 40.0 and over, adult: Secondary | ICD-10-CM | POA: Diagnosis not present

## 2016-12-21 DIAGNOSIS — N182 Chronic kidney disease, stage 2 (mild): Secondary | ICD-10-CM | POA: Diagnosis not present

## 2016-12-21 DIAGNOSIS — E538 Deficiency of other specified B group vitamins: Secondary | ICD-10-CM | POA: Diagnosis not present

## 2017-01-04 DIAGNOSIS — E109 Type 1 diabetes mellitus without complications: Secondary | ICD-10-CM | POA: Diagnosis not present

## 2017-01-16 DIAGNOSIS — M75102 Unspecified rotator cuff tear or rupture of left shoulder, not specified as traumatic: Secondary | ICD-10-CM | POA: Diagnosis not present

## 2017-01-16 DIAGNOSIS — Z4789 Encounter for other orthopedic aftercare: Secondary | ICD-10-CM | POA: Diagnosis not present

## 2017-02-19 DIAGNOSIS — Z23 Encounter for immunization: Secondary | ICD-10-CM | POA: Diagnosis not present

## 2017-03-26 ENCOUNTER — Ambulatory Visit: Payer: Medicare HMO | Admitting: Podiatry

## 2017-03-26 ENCOUNTER — Encounter: Payer: Self-pay | Admitting: Podiatry

## 2017-03-26 VITALS — BP 146/65 | HR 80

## 2017-03-26 DIAGNOSIS — M21611 Bunion of right foot: Secondary | ICD-10-CM | POA: Diagnosis not present

## 2017-03-26 DIAGNOSIS — M2011 Hallux valgus (acquired), right foot: Secondary | ICD-10-CM

## 2017-03-26 DIAGNOSIS — M2041 Other hammer toe(s) (acquired), right foot: Secondary | ICD-10-CM | POA: Diagnosis not present

## 2017-03-26 DIAGNOSIS — M2042 Other hammer toe(s) (acquired), left foot: Secondary | ICD-10-CM | POA: Diagnosis not present

## 2017-03-26 DIAGNOSIS — E1142 Type 2 diabetes mellitus with diabetic polyneuropathy: Secondary | ICD-10-CM | POA: Diagnosis not present

## 2017-03-26 NOTE — Progress Notes (Signed)
  Subjective:  Patient ID: Johnathan Wall, male    DOB: 1945-07-08,  MRN: [id number]  Chief Complaint  Patient presents with  . Diabetes    Diabetic foot exam pt requesting shoes    71 y.o. male returns for eval for DM shoes. Denies numbness and tingling in his feet.  Reports that he has previously had diabetic inserts and that they are worn out and presents for a new pair today.  Sees Dr. Buddy Duty for his diabetes last seen 6 months ago  Objective:   Vitals:   03/26/17 0920  BP: (!) 146/65  Pulse: 80   General AA&O x3. Normal mood and affect.  Vascular Dorsalis pedis pulses present 2+ bilaterally  Posterior tibial pulses present 2+ bilaterally  Capillary refill normal to all digits. Pedal hair growth diminished.  Neurologic Epicritic sensation present bilaterally. Protective sensation with 5.07 monofilament  absent bilaterally. Vibratory sensation Diminished bilaterally.  Dermatologic No open lesions. Interspaces clear of maceration.  Normal skin temperature and turgor. Hyperkeratotic lesions: Pinch callus right hallux IPJ plantar right first metatarsal. Nails: normal- no clubbing or nail changes  Orthopedic: No history of amputation. MMT 5/5 in dorsiflexion, plantarflexion, inversion, and eversion. Normal lower extremity joint ROM without pain or crepitus. Lesser digital contractures bilateral, right hallux abductovalgus deformity   Assessment & Plan:  Patient was evaluated and treated and all questions answered.  Diabetes with DPN; hammertoes bilat, hallux valgus right -Rx for DM shoes written -Will make appt for casting for shoes.  F/u after shoe fabrication.

## 2017-03-29 ENCOUNTER — Other Ambulatory Visit: Payer: Medicare HMO | Admitting: *Deleted

## 2017-04-13 DIAGNOSIS — J208 Acute bronchitis due to other specified organisms: Secondary | ICD-10-CM | POA: Diagnosis not present

## 2017-04-17 DIAGNOSIS — Z5181 Encounter for therapeutic drug level monitoring: Secondary | ICD-10-CM | POA: Diagnosis not present

## 2017-04-17 DIAGNOSIS — E538 Deficiency of other specified B group vitamins: Secondary | ICD-10-CM | POA: Diagnosis not present

## 2017-04-17 DIAGNOSIS — N182 Chronic kidney disease, stage 2 (mild): Secondary | ICD-10-CM | POA: Diagnosis not present

## 2017-04-17 DIAGNOSIS — E1142 Type 2 diabetes mellitus with diabetic polyneuropathy: Secondary | ICD-10-CM | POA: Diagnosis not present

## 2017-04-17 DIAGNOSIS — E669 Obesity, unspecified: Secondary | ICD-10-CM | POA: Diagnosis not present

## 2017-04-17 DIAGNOSIS — E1122 Type 2 diabetes mellitus with diabetic chronic kidney disease: Secondary | ICD-10-CM | POA: Diagnosis not present

## 2017-04-17 DIAGNOSIS — Z794 Long term (current) use of insulin: Secondary | ICD-10-CM | POA: Diagnosis not present

## 2017-04-30 ENCOUNTER — Ambulatory Visit: Payer: Medicare HMO | Admitting: Orthotics

## 2017-04-30 DIAGNOSIS — M2041 Other hammer toe(s) (acquired), right foot: Secondary | ICD-10-CM

## 2017-04-30 DIAGNOSIS — E1142 Type 2 diabetes mellitus with diabetic polyneuropathy: Secondary | ICD-10-CM

## 2017-04-30 DIAGNOSIS — M2042 Other hammer toe(s) (acquired), left foot: Principal | ICD-10-CM

## 2017-04-30 NOTE — Progress Notes (Signed)
Shoes were too narrow for patients liking... [id number] [facility] ordered.

## 2017-05-10 ENCOUNTER — Ambulatory Visit (INDEPENDENT_AMBULATORY_CARE_PROVIDER_SITE_OTHER): Payer: Medicare HMO | Admitting: Orthotics

## 2017-05-10 DIAGNOSIS — E1140 Type 2 diabetes mellitus with diabetic neuropathy, unspecified: Secondary | ICD-10-CM

## 2017-05-10 DIAGNOSIS — M2011 Hallux valgus (acquired), right foot: Secondary | ICD-10-CM | POA: Diagnosis not present

## 2017-05-10 DIAGNOSIS — M722 Plantar fascial fibromatosis: Secondary | ICD-10-CM

## 2017-05-10 DIAGNOSIS — E1142 Type 2 diabetes mellitus with diabetic polyneuropathy: Secondary | ICD-10-CM | POA: Diagnosis not present

## 2017-05-10 DIAGNOSIS — M21611 Bunion of right foot: Secondary | ICD-10-CM

## 2017-05-10 NOTE — Progress Notes (Signed)

## 2017-05-14 ENCOUNTER — Other Ambulatory Visit: Payer: Medicare HMO | Admitting: Orthotics

## 2017-05-22 DIAGNOSIS — G4733 Obstructive sleep apnea (adult) (pediatric): Secondary | ICD-10-CM | POA: Diagnosis not present

## 2017-06-01 DIAGNOSIS — E109 Type 1 diabetes mellitus without complications: Secondary | ICD-10-CM | POA: Diagnosis not present

## 2017-08-31 DIAGNOSIS — E109 Type 1 diabetes mellitus without complications: Secondary | ICD-10-CM | POA: Diagnosis not present

## 2017-09-12 DIAGNOSIS — Z5181 Encounter for therapeutic drug level monitoring: Secondary | ICD-10-CM | POA: Diagnosis not present

## 2017-09-12 DIAGNOSIS — N182 Chronic kidney disease, stage 2 (mild): Secondary | ICD-10-CM | POA: Diagnosis not present

## 2017-09-12 DIAGNOSIS — E1142 Type 2 diabetes mellitus with diabetic polyneuropathy: Secondary | ICD-10-CM | POA: Diagnosis not present

## 2017-09-12 DIAGNOSIS — E538 Deficiency of other specified B group vitamins: Secondary | ICD-10-CM | POA: Diagnosis not present

## 2017-09-12 DIAGNOSIS — E669 Obesity, unspecified: Secondary | ICD-10-CM | POA: Diagnosis not present

## 2017-09-12 DIAGNOSIS — Z794 Long term (current) use of insulin: Secondary | ICD-10-CM | POA: Diagnosis not present

## 2017-09-12 DIAGNOSIS — R221 Localized swelling, mass and lump, neck: Secondary | ICD-10-CM | POA: Diagnosis not present

## 2017-09-12 DIAGNOSIS — E1122 Type 2 diabetes mellitus with diabetic chronic kidney disease: Secondary | ICD-10-CM | POA: Diagnosis not present

## 2017-09-18 DIAGNOSIS — H9313 Tinnitus, bilateral: Secondary | ICD-10-CM | POA: Insufficient documentation

## 2017-09-18 DIAGNOSIS — J342 Deviated nasal septum: Secondary | ICD-10-CM

## 2017-09-18 DIAGNOSIS — G4733 Obstructive sleep apnea (adult) (pediatric): Secondary | ICD-10-CM

## 2017-09-18 DIAGNOSIS — R221 Localized swelling, mass and lump, neck: Secondary | ICD-10-CM | POA: Insufficient documentation

## 2017-09-18 HISTORY — DX: Localized swelling, mass and lump, neck: R22.1

## 2017-09-18 HISTORY — DX: Deviated nasal septum: J34.2

## 2017-09-18 HISTORY — DX: Tinnitus, bilateral: H93.13

## 2017-09-18 HISTORY — DX: Obstructive sleep apnea (adult) (pediatric): G47.33

## 2017-09-20 ENCOUNTER — Other Ambulatory Visit: Payer: Self-pay | Admitting: Otolaryngology

## 2017-09-20 DIAGNOSIS — R221 Localized swelling, mass and lump, neck: Secondary | ICD-10-CM

## 2017-09-21 ENCOUNTER — Telehealth: Payer: Self-pay | Admitting: Nurse Practitioner

## 2017-09-21 NOTE — Telephone Encounter (Signed)
Phone call to patient to review 13hr prep instructions and inform patient it had been called into pharmacy. Pt verbalized understanding. June 30th at 2000 - Prednisone 50mg  July 1st at 0200 - Prednisone 50mg  July 1st at 0800 - Prednisone 50mg  and Benadryl 50mg 

## 2017-10-08 ENCOUNTER — Ambulatory Visit
Admission: RE | Admit: 2017-10-08 | Discharge: 2017-10-08 | Disposition: A | Discharge status: Home / Self Care | Payer: Commercial Managed Care - HMO | Source: Ambulatory Visit | Attending: Otolaryngology | Admitting: Otolaryngology

## 2017-10-08 DIAGNOSIS — R221 Localized swelling, mass and lump, neck: Secondary | ICD-10-CM | POA: Diagnosis not present

## 2017-10-08 MED ORDER — IOHEXOL 300 MG/ML  SOLN
75.0000 mL | Freq: Once | INTRAMUSCULAR | Status: AC | PRN
  Administered 2017-10-08: 75 mL via INTRAVENOUS

## 2017-11-19 ENCOUNTER — Ambulatory Visit: Payer: Medicare HMO | Admitting: Orthotics

## 2017-11-19 ENCOUNTER — Ambulatory Visit: Payer: Medicare HMO | Admitting: Podiatry

## 2017-11-19 DIAGNOSIS — M79671 Pain in right foot: Secondary | ICD-10-CM

## 2017-11-19 DIAGNOSIS — M21611 Bunion of right foot: Secondary | ICD-10-CM

## 2017-11-19 DIAGNOSIS — M2011 Hallux valgus (acquired), right foot: Secondary | ICD-10-CM

## 2017-11-19 DIAGNOSIS — E1142 Type 2 diabetes mellitus with diabetic polyneuropathy: Secondary | ICD-10-CM

## 2017-11-19 DIAGNOSIS — M779 Enthesopathy, unspecified: Secondary | ICD-10-CM | POA: Diagnosis not present

## 2017-11-19 DIAGNOSIS — M79672 Pain in left foot: Principal | ICD-10-CM

## 2017-11-19 NOTE — Progress Notes (Signed)
Recast for [id number] due to poor quality of [facility]

## 2017-12-05 DIAGNOSIS — D225 Melanocytic nevi of trunk: Secondary | ICD-10-CM | POA: Diagnosis not present

## 2017-12-05 DIAGNOSIS — L570 Actinic keratosis: Secondary | ICD-10-CM | POA: Diagnosis not present

## 2017-12-05 DIAGNOSIS — D1801 Hemangioma of skin and subcutaneous tissue: Secondary | ICD-10-CM | POA: Diagnosis not present

## 2017-12-05 DIAGNOSIS — L821 Other seborrheic keratosis: Secondary | ICD-10-CM | POA: Diagnosis not present

## 2017-12-08 NOTE — Progress Notes (Signed)
  Subjective:  Patient ID: Johnathan Wall, male    DOB: 20-Jun-1945,  MRN: [id number]  Chief Complaint  Patient presents with  . shoes    diabeti shoes causing pain to pt. Pt. stated," my diabetic shoes are not working. Thye're making my feet hurt. I thinkn it's the inserts that's doing it."    72 y.o. male returns for follow-up of his diabetic shoes.  States that they are not working states they are making his feet.  He thinks that the inserts are doing it.  Objective:   There were no vitals filed for this visit. General AA&O x3. Normal mood and affect.  Vascular Dorsalis pedis pulses present 2+ bilaterally  Posterior tibial pulses present 2+ bilaterally  Capillary refill normal to all digits. Pedal hair growth diminished.  Neurologic Epicritic sensation present bilaterally. Protective sensation with 5.07 monofilament  absent bilaterally. Vibratory sensation Diminished bilaterally.  Dermatologic No open lesions. Interspaces clear of maceration.  Normal skin temperature and turgor. Hyperkeratotic lesions: Pinch callus right hallux IPJ plantar right first metatarsal. Nails: normal- no clubbing or nail changes  Orthopedic: No history of amputation. MMT 5/5 in dorsiflexion, plantarflexion, inversion, and eversion. Normal lower extremity joint ROM without pain or crepitus. Lesser digital contractures bilateral, right hallux abductovalgus deformity   Assessment & Plan:  Patient was evaluated and treated and all questions answered.  Diabetes with DPN; hammertoes bilat, hallux valgus right -We will make appointment for patient for just diabetic shoes.  F/u after shoe modification.

## 2017-12-24 DIAGNOSIS — E1142 Type 2 diabetes mellitus with diabetic polyneuropathy: Secondary | ICD-10-CM | POA: Diagnosis not present

## 2017-12-24 DIAGNOSIS — N182 Chronic kidney disease, stage 2 (mild): Secondary | ICD-10-CM | POA: Diagnosis not present

## 2017-12-24 DIAGNOSIS — E669 Obesity, unspecified: Secondary | ICD-10-CM | POA: Diagnosis not present

## 2017-12-24 DIAGNOSIS — E538 Deficiency of other specified B group vitamins: Secondary | ICD-10-CM | POA: Diagnosis not present

## 2017-12-26 ENCOUNTER — Ambulatory Visit: Payer: Medicare HMO | Admitting: *Deleted

## 2017-12-26 DIAGNOSIS — E1142 Type 2 diabetes mellitus with diabetic polyneuropathy: Secondary | ICD-10-CM

## 2017-12-26 DIAGNOSIS — E538 Deficiency of other specified B group vitamins: Secondary | ICD-10-CM | POA: Diagnosis not present

## 2017-12-26 DIAGNOSIS — Z794 Long term (current) use of insulin: Secondary | ICD-10-CM | POA: Diagnosis not present

## 2017-12-26 DIAGNOSIS — Z5181 Encounter for therapeutic drug level monitoring: Secondary | ICD-10-CM | POA: Diagnosis not present

## 2017-12-26 DIAGNOSIS — Z23 Encounter for immunization: Secondary | ICD-10-CM | POA: Diagnosis not present

## 2017-12-26 DIAGNOSIS — N182 Chronic kidney disease, stage 2 (mild): Secondary | ICD-10-CM | POA: Diagnosis not present

## 2017-12-26 DIAGNOSIS — E669 Obesity, unspecified: Secondary | ICD-10-CM | POA: Diagnosis not present

## 2017-12-26 DIAGNOSIS — E1122 Type 2 diabetes mellitus with diabetic chronic kidney disease: Secondary | ICD-10-CM | POA: Diagnosis not present

## 2017-12-26 NOTE — Progress Notes (Signed)
Patient ID: Johnathan Wall, male   DOB: 02/21/1946, 72 y.o.   MRN: [id number]   Patient presents to pick up remade custom diabetic inserts.  Inserts are tried on for comfort and fit.  Patient states they feel much bette.  Patient received 3 new sets of custom diabetic inserts and will follow up with Dr. March Rummage as scheduled.

## 2018-01-22 DIAGNOSIS — G4733 Obstructive sleep apnea (adult) (pediatric): Secondary | ICD-10-CM | POA: Diagnosis not present

## 2018-01-22 DIAGNOSIS — R221 Localized swelling, mass and lump, neck: Secondary | ICD-10-CM | POA: Diagnosis not present

## 2018-01-22 DIAGNOSIS — H9313 Tinnitus, bilateral: Secondary | ICD-10-CM | POA: Diagnosis not present

## 2018-01-22 DIAGNOSIS — J342 Deviated nasal septum: Secondary | ICD-10-CM | POA: Diagnosis not present

## 2018-02-08 DIAGNOSIS — M654 Radial styloid tenosynovitis [de Quervain]: Secondary | ICD-10-CM | POA: Diagnosis not present

## 2018-03-13 DIAGNOSIS — M79644 Pain in right finger(s): Secondary | ICD-10-CM

## 2018-03-13 DIAGNOSIS — M654 Radial styloid tenosynovitis [de Quervain]: Secondary | ICD-10-CM | POA: Insufficient documentation

## 2018-03-13 DIAGNOSIS — M79645 Pain in left finger(s): Secondary | ICD-10-CM | POA: Diagnosis not present

## 2018-03-13 HISTORY — DX: Radial styloid tenosynovitis (de quervain): M65.4

## 2018-03-13 HISTORY — DX: Pain in right finger(s): M79.644

## 2018-03-18 DIAGNOSIS — G4733 Obstructive sleep apnea (adult) (pediatric): Secondary | ICD-10-CM | POA: Diagnosis not present

## 2018-06-12 DIAGNOSIS — E109 Type 1 diabetes mellitus without complications: Secondary | ICD-10-CM | POA: Diagnosis not present

## 2018-06-20 DIAGNOSIS — E1142 Type 2 diabetes mellitus with diabetic polyneuropathy: Secondary | ICD-10-CM | POA: Diagnosis not present

## 2018-06-20 DIAGNOSIS — Z5181 Encounter for therapeutic drug level monitoring: Secondary | ICD-10-CM | POA: Diagnosis not present

## 2018-06-20 DIAGNOSIS — E1122 Type 2 diabetes mellitus with diabetic chronic kidney disease: Secondary | ICD-10-CM | POA: Diagnosis not present

## 2018-06-20 DIAGNOSIS — Z794 Long term (current) use of insulin: Secondary | ICD-10-CM | POA: Diagnosis not present

## 2018-06-20 DIAGNOSIS — E669 Obesity, unspecified: Secondary | ICD-10-CM | POA: Diagnosis not present

## 2018-06-20 DIAGNOSIS — N182 Chronic kidney disease, stage 2 (mild): Secondary | ICD-10-CM | POA: Diagnosis not present

## 2018-08-13 DIAGNOSIS — G4733 Obstructive sleep apnea (adult) (pediatric): Secondary | ICD-10-CM | POA: Diagnosis not present

## 2018-09-30 DIAGNOSIS — E109 Type 1 diabetes mellitus without complications: Secondary | ICD-10-CM | POA: Diagnosis not present

## 2018-09-30 DIAGNOSIS — G4733 Obstructive sleep apnea (adult) (pediatric): Secondary | ICD-10-CM | POA: Diagnosis not present

## 2018-10-01 DIAGNOSIS — N182 Chronic kidney disease, stage 2 (mild): Secondary | ICD-10-CM | POA: Diagnosis not present

## 2018-10-01 DIAGNOSIS — E1142 Type 2 diabetes mellitus with diabetic polyneuropathy: Secondary | ICD-10-CM | POA: Diagnosis not present

## 2018-10-01 DIAGNOSIS — E1122 Type 2 diabetes mellitus with diabetic chronic kidney disease: Secondary | ICD-10-CM | POA: Diagnosis not present

## 2018-10-01 DIAGNOSIS — Z5181 Encounter for therapeutic drug level monitoring: Secondary | ICD-10-CM | POA: Diagnosis not present

## 2018-10-01 DIAGNOSIS — Z794 Long term (current) use of insulin: Secondary | ICD-10-CM | POA: Diagnosis not present

## 2018-10-01 DIAGNOSIS — E669 Obesity, unspecified: Secondary | ICD-10-CM | POA: Diagnosis not present

## 2018-11-08 ENCOUNTER — Encounter: Payer: Self-pay | Admitting: Gastroenterology

## 2018-11-19 ENCOUNTER — Encounter: Payer: Self-pay | Admitting: Gastroenterology

## 2018-11-26 ENCOUNTER — Ambulatory Visit (AMBULATORY_SURGERY_CENTER): Payer: Self-pay | Admitting: *Deleted

## 2018-11-26 ENCOUNTER — Other Ambulatory Visit: Payer: Self-pay

## 2018-11-26 VITALS — Temp 97.1°F | Ht 67.0 in | Wt 228.2 lb

## 2018-11-26 DIAGNOSIS — Z86010 Personal history of colonic polyps: Secondary | ICD-10-CM

## 2018-11-26 MED ORDER — PEG 3350-KCL-NA BICARB-NACL 420 G PO SOLR: 4000.0000 mL | Freq: Once | ORAL | 0 refills | Status: AC

## 2018-11-26 NOTE — Progress Notes (Signed)
No egg or soy allergy known to patient  No issues with past sedation with any surgeries  or procedures, no intubation problems  No diet pills per patient No home 02 use per patient  No blood thinners per patient  Pt denies issues with constipation  No A fib or A flutter  EMMI video sent to pt's e mail  

## 2018-12-05 DIAGNOSIS — D225 Melanocytic nevi of trunk: Secondary | ICD-10-CM | POA: Diagnosis not present

## 2018-12-05 DIAGNOSIS — L570 Actinic keratosis: Secondary | ICD-10-CM | POA: Diagnosis not present

## 2018-12-05 DIAGNOSIS — D1801 Hemangioma of skin and subcutaneous tissue: Secondary | ICD-10-CM | POA: Diagnosis not present

## 2018-12-05 DIAGNOSIS — L821 Other seborrheic keratosis: Secondary | ICD-10-CM | POA: Diagnosis not present

## 2018-12-06 ENCOUNTER — Encounter: Payer: Self-pay | Admitting: Gastroenterology

## 2018-12-12 ENCOUNTER — Telehealth: Payer: Self-pay

## 2018-12-12 NOTE — Telephone Encounter (Signed)
Covid-19 screening questions   Do you now or have you had a fever in the last 14 days? NO   Do you have any respiratory symptoms of shortness of breath or cough now or in the last 14 days? NO  Do you have any family members or close contacts with diagnosed or suspected Covid-19 in the past 14 days? NO  Have you been tested for Covid-19 and found to be positive? NO        

## 2018-12-13 ENCOUNTER — Ambulatory Visit (AMBULATORY_SURGERY_CENTER): Payer: Medicare HMO | Admitting: Gastroenterology

## 2018-12-13 ENCOUNTER — Other Ambulatory Visit: Payer: Self-pay

## 2018-12-13 ENCOUNTER — Encounter: Payer: Self-pay | Admitting: Gastroenterology

## 2018-12-13 VITALS — BP 134/70 | HR 65 | Temp 98.4°F | Resp 18 | Ht 67.0 in | Wt 228.0 lb

## 2018-12-13 DIAGNOSIS — G4733 Obstructive sleep apnea (adult) (pediatric): Secondary | ICD-10-CM | POA: Diagnosis not present

## 2018-12-13 DIAGNOSIS — Z86010 Personal history of colonic polyps: Secondary | ICD-10-CM | POA: Diagnosis not present

## 2018-12-13 DIAGNOSIS — E119 Type 2 diabetes mellitus without complications: Secondary | ICD-10-CM | POA: Diagnosis not present

## 2018-12-13 DIAGNOSIS — K5730 Diverticulosis of large intestine without perforation or abscess without bleeding: Secondary | ICD-10-CM

## 2018-12-13 DIAGNOSIS — E785 Hyperlipidemia, unspecified: Secondary | ICD-10-CM | POA: Diagnosis not present

## 2018-12-13 DIAGNOSIS — I10 Essential (primary) hypertension: Secondary | ICD-10-CM | POA: Diagnosis not present

## 2018-12-13 DIAGNOSIS — D123 Benign neoplasm of transverse colon: Secondary | ICD-10-CM

## 2018-12-13 DIAGNOSIS — K621 Rectal polyp: Secondary | ICD-10-CM | POA: Diagnosis not present

## 2018-12-13 DIAGNOSIS — D124 Benign neoplasm of descending colon: Secondary | ICD-10-CM | POA: Diagnosis not present

## 2018-12-13 DIAGNOSIS — D128 Benign neoplasm of rectum: Secondary | ICD-10-CM

## 2018-12-13 MED ORDER — SODIUM CHLORIDE 0.9 % IV SOLN: 500.0000 mL | Freq: Once | INTRAVENOUS | Status: DC

## 2018-12-13 NOTE — Patient Instructions (Signed)
Thank you for allowing [place] to care for you today!  Await pathology results by mail, approximately 2 weeks.  Recommendation will be made at that time for next colonoscopy at that time.   Resume previous diet and medications today.  Return to your normal activities tomorrow.     YOU HAD AN ENDOSCOPIC PROCEDURE TODAY AT [facility]:   Refer to the procedure report that was given to you for any specific questions about what was found during the examination.  If the procedure report does not answer your questions, please call your gastroenterologist to clarify.  If you requested that your care partner not be given the details of your procedure findings, then the procedure report has been included in a sealed envelope for you to review at your convenience later.  YOU SHOULD EXPECT: Some feelings of bloating in the abdomen. Passage of more gas than usual.  Walking can help get rid of the air that was put into your GI tract during the procedure and reduce the bloating. If you had a lower endoscopy (such as a colonoscopy or flexible sigmoidoscopy) you may notice spotting of blood in your stool or on the toilet paper. If you underwent a bowel prep for your procedure, you may not have a normal bowel movement for a few days.  Please Note:  You might notice some irritation and congestion in your nose or some drainage.  This is from the oxygen used during your procedure.  There is no need for concern and it should clear up in a day or so.  SYMPTOMS TO REPORT IMMEDIATELY:   Following lower endoscopy (colonoscopy or flexible sigmoidoscopy):  Excessive amounts of blood in the stool  Significant tenderness or worsening of abdominal pains  Swelling of the abdomen that is new, acute  Fever of 100F or higher   For urgent or emergent issues, a gastroenterologist can be reached at any hour by calling [phone].   DIET:  We do recommend a small meal at first, but then you may proceed to  your regular diet.  Drink plenty of fluids but you should avoid alcoholic beverages for 24 hours.  ACTIVITY:  You should plan to take it easy for the rest of today and you should NOT DRIVE or use heavy machinery until tomorrow (because of the sedation medicines used during the test).    FOLLOW UP: Our staff will call the number listed on your records 48-72 hours following your procedure to check on you and address any questions or concerns that you may have regarding the information given to you following your procedure. If we do not reach you, we will leave a message.  We will attempt to reach you two times.  During this call, we will ask if you have developed any symptoms of COVID 19. If you develop any symptoms (ie: fever, flu-like symptoms, shortness of breath, cough etc.) before then, please call [phone].  If you test positive for Covid 19 in the 2 weeks post procedure, please call and report this information to [place].    If any biopsies were taken you will be contacted by phone or by letter within the next 1-3 weeks.  Please call us at [phone] if you have not heard about the biopsies in 3 weeks.    SIGNATURES/CONFIDENTIALITY: You and/or your care partner have signed paperwork which will be entered into your electronic medical record.  These signatures attest to the fact that that the information above on your  After Visit Summary has been reviewed and is understood.  Full responsibility of the confidentiality of this discharge information lies with you and/or your care-partner.

## 2018-12-13 NOTE — Progress Notes (Signed)
Report to PACU, RN, vss, BBS= Clear.  

## 2018-12-13 NOTE — Progress Notes (Signed)
Temperature- Johnathan Wall VS- [name]  Pt's states no medical or surgical changes since previsit or office visit.  

## 2018-12-13 NOTE — Progress Notes (Signed)
Called to room to assist during endoscopic procedure.  Patient ID and intended procedure confirmed with present staff. Received instructions for my participation in the procedure from the performing physician.  

## 2018-12-13 NOTE — Op Note (Signed)
[place] Patient Name: Emre Hubby Procedure Date: 12/13/2018 8:37 AM MRN: [id number] Endoscopist: Milus Banister , MD Age: 73 Referring MD:  Date of Birth: [birth year] Gender: Male Account #: [id number] Procedure:                Colonoscopy Indications:              Screening for colorectal malignant neoplasm; subCM                            TA 2001 Dr. Sharlett Iles; no pre-cancerous polyps                            2005, 2010 Medicines:                Monitored Anesthesia Care Procedure:                Pre-Anesthesia Assessment:                           - Prior to the procedure, a History and Physical                            was performed, and patient medications and                            allergies were reviewed. The patient's tolerance of                            previous anesthesia was also reviewed. The risks                            and benefits of the procedure and the sedation                            options and risks were discussed with the patient.                            All questions were answered, and informed consent                            was obtained. Prior Anticoagulants: The patient has                            taken no previous anticoagulant or antiplatelet                            agents. ASA Grade Assessment: II - A patient with                            mild systemic disease. After reviewing the risks                            and benefits, the patient was deemed in  satisfactory condition to undergo the procedure.                           After obtaining informed consent, the colonoscope                            was passed under direct vision. Throughout the                            procedure, the patient's blood pressure, pulse, and                            oxygen saturations were monitored continuously. The                            Colonoscope was introduced through the anus and                        advanced to the the cecum, identified by                            appendiceal orifice and ileocecal valve. The                            colonoscopy was performed without difficulty. The                            patient tolerated the procedure well. The quality                            of the bowel preparation was good. The ileocecal                            valve, appendiceal orifice, and rectum were                            photographed. Scope In: 8:40:40 AM Scope Out: 8:56:07 AM Scope Withdrawal Time: 0 hours 13 minutes 39 seconds  Total Procedure Duration: 0 hours 15 minutes 27 seconds  Findings:                 Four sessile polyps were found in the descending                            colon and transverse colon. The polyps were 3 to 9                            mm in size. These polyps were removed with a cold                            snare. Resection and retrieval were complete.                           A 14 mm polyp was found in the rectum. The polyp  was pedunculated and ulcerated superficially. The                            polyp was removed with a hot snare. Resection and                            retrieval were complete. Area was tattooed with an                            injection of Spot (carbon black).                           Multiple small and large-mouthed diverticula were                            found in the left colon.                           The exam was otherwise without abnormality on                            direct and retroflexion views. Complications:            No immediate complications. Estimated blood loss:                            None. Estimated Blood Loss:     Estimated blood loss: none. Impression:               - Four 3 to 9 mm polyps in the descending colon and                            in the transverse colon, removed with a cold snare.                            Resected and  retrieved.                           - One 14 mm polyp in the rectum, removed with a hot                            snare. Resected and retrieved. The resection site                            was tattooed to aid in future localization if                            necessary.                           - Left colon diverticulosis.                           - The examination was otherwise normal on direct  and retroflexion views. Recommendation:           - Patient has a contact number available for                            emergencies. The signs and symptoms of potential                            delayed complications were discussed with the                            patient. Return to normal activities tomorrow.                            Written discharge instructions were provided to the                            patient.                           - Resume previous diet.                           - Continue present medications.                           - Repeat colonoscopy is recommended. The                            colonoscopy date will be determined after pathology                            results from today's exam become available for                            review. Milus Banister, MD 12/13/2018 9:01:56 AM This report has been signed electronically.

## 2018-12-18 ENCOUNTER — Telehealth: Payer: Self-pay | Admitting: *Deleted

## 2018-12-18 NOTE — Telephone Encounter (Signed)
  Follow up Call-  Call back number 12/13/2018  Post procedure Call Back phone  # [phone]  Permission to leave phone message Yes  Some recent data might be hidden     Patient questions:  Do you have a fever, pain , or abdominal swelling? No. Pain Score  0 *  Have you tolerated food without any problems? Yes.    Have you been able to return to your normal activities? Yes.    Do you have any questions about your discharge instructions: Diet   No. Medications  No. Follow up visit  No.  Do you have questions or concerns about your Care? No.  Actions: * If pain score is 4 or above: No action needed, pain <4.  1. Have you developed a fever since your procedure? no  2.   Have you had an respiratory symptoms (SOB or cough) since your procedure? no  3.   Have you tested positive for COVID 19 since your procedure no  4.   Have you had any family members/close contacts diagnosed with the COVID 19 since your procedure?  no   If yes to any of these questions please route to Joylene John, RN and Alphonsa Gin, Therapist, sports.

## 2018-12-20 ENCOUNTER — Encounter: Payer: Self-pay | Admitting: Gastroenterology

## 2018-12-20 NOTE — Telephone Encounter (Signed)
Opened in error.  No call made. maw

## 2018-12-27 DIAGNOSIS — E109 Type 1 diabetes mellitus without complications: Secondary | ICD-10-CM | POA: Diagnosis not present

## 2019-01-14 DIAGNOSIS — E1122 Type 2 diabetes mellitus with diabetic chronic kidney disease: Secondary | ICD-10-CM | POA: Diagnosis not present

## 2019-01-14 DIAGNOSIS — N182 Chronic kidney disease, stage 2 (mild): Secondary | ICD-10-CM | POA: Diagnosis not present

## 2019-01-14 DIAGNOSIS — Z794 Long term (current) use of insulin: Secondary | ICD-10-CM | POA: Diagnosis not present

## 2019-01-14 DIAGNOSIS — E1142 Type 2 diabetes mellitus with diabetic polyneuropathy: Secondary | ICD-10-CM | POA: Diagnosis not present

## 2019-01-14 DIAGNOSIS — Z23 Encounter for immunization: Secondary | ICD-10-CM | POA: Diagnosis not present

## 2019-01-14 DIAGNOSIS — Z5181 Encounter for therapeutic drug level monitoring: Secondary | ICD-10-CM | POA: Diagnosis not present

## 2019-01-14 DIAGNOSIS — E1165 Type 2 diabetes mellitus with hyperglycemia: Secondary | ICD-10-CM | POA: Diagnosis not present

## 2019-01-14 DIAGNOSIS — E669 Obesity, unspecified: Secondary | ICD-10-CM | POA: Diagnosis not present

## 2019-02-12 DIAGNOSIS — G4733 Obstructive sleep apnea (adult) (pediatric): Secondary | ICD-10-CM | POA: Diagnosis not present

## 2019-04-30 DIAGNOSIS — Z794 Long term (current) use of insulin: Secondary | ICD-10-CM | POA: Diagnosis not present

## 2019-04-30 DIAGNOSIS — E1142 Type 2 diabetes mellitus with diabetic polyneuropathy: Secondary | ICD-10-CM | POA: Diagnosis not present

## 2019-04-30 DIAGNOSIS — Z7189 Other specified counseling: Secondary | ICD-10-CM | POA: Diagnosis not present

## 2019-04-30 DIAGNOSIS — N182 Chronic kidney disease, stage 2 (mild): Secondary | ICD-10-CM | POA: Diagnosis not present

## 2019-04-30 DIAGNOSIS — E669 Obesity, unspecified: Secondary | ICD-10-CM | POA: Diagnosis not present

## 2019-04-30 DIAGNOSIS — E1165 Type 2 diabetes mellitus with hyperglycemia: Secondary | ICD-10-CM | POA: Diagnosis not present

## 2019-04-30 DIAGNOSIS — E1122 Type 2 diabetes mellitus with diabetic chronic kidney disease: Secondary | ICD-10-CM | POA: Diagnosis not present

## 2019-04-30 DIAGNOSIS — Z5181 Encounter for therapeutic drug level monitoring: Secondary | ICD-10-CM | POA: Diagnosis not present

## 2019-07-01 ENCOUNTER — Ambulatory Visit (INDEPENDENT_AMBULATORY_CARE_PROVIDER_SITE_OTHER): Payer: Medicare HMO | Admitting: Nurse Practitioner

## 2019-07-01 ENCOUNTER — Other Ambulatory Visit: Payer: Self-pay

## 2019-07-01 ENCOUNTER — Encounter: Payer: Self-pay | Admitting: Nurse Practitioner

## 2019-07-01 VITALS — BP 140/62 | HR 82 | Temp 98.8°F | Ht 64.5 in | Wt 227.0 lb

## 2019-07-01 DIAGNOSIS — G4733 Obstructive sleep apnea (adult) (pediatric): Secondary | ICD-10-CM

## 2019-07-01 NOTE — Progress Notes (Signed)
Established Patient Office Visit  Subjective:  Patient ID: Johnathan Wall, male    DOB: 1946-01-23  Age: 74 y.o. MRN: [id number]  CC: Patient  is a  74 year old male. He is here for CPAP replacement.  The patient's medications were reviewed and reconciled since the patient's last visit.  History details were provided by the patient. The history appears to be reliable.   Chief Complaint  Patient presents with  . SLEEP APNEA    NEEDS NEW CPAP MACHINE    HPI Johnathan Wall Patient is following up and  evaluated for obstructive sleep apnea. This has been noted for the past > 5 years.  Sleep has been disrupted by frequent awakenings.  Associated symptoms include snoring and observed apneic episodes.  There are no current identifiable stressors.  Medical history is positive for sleep apnea.  See medication list for a list of his current prescriptions.  Currently on CPAP. Patient reports CPAP is broken and will need a new machine and supplies.     Past Medical History:  Diagnosis Date  . Diabetes mellitus without complication ([place])   . Hypertension   . Mixed hyperlipidemia   . Obstructive sleep apnea (adult) (pediatric)   . Sleep apnea    cpap  . Type 2 diabetes mellitus with diabetic chronic kidney disease ([place])   . Type 2 diabetes mellitus with diabetic polyneuropathy [facility])     Past Surgical History:  Procedure Laterality Date  . COLONOSCOPY    . FOOT SURGERY    . KNEE ARTHROPLASTY     right  . POLYPECTOMY    . SHOULDER ARTHROSCOPY W/ ROTATOR CUFF REPAIR     bilateral    Family History  Problem Relation Age of Onset  . Diabetes type II Mother   . Heart attack Brother   . Colon cancer Neg Hx   . Colon polyps Neg Hx   . Esophageal cancer Neg Hx   . Rectal cancer Neg Hx   . Stomach cancer Neg Hx     Social History   Socioeconomic History  . Marital status: Married    Spouse name: Not on file  . Number of children: 1  . Years of education: Not on file  . Highest  education level: Not on file  Occupational History  . Not on file  Tobacco Use  . Smoking status: Never Smoker  . Smokeless tobacco: Never Used  Substance and Sexual Activity  . Alcohol use: No    Alcohol/week: 0.0 standard drinks    Comment: occ.  . Drug use: No  . Sexual activity: Not on file  Other Topics Concern  . Not on file  Social History Narrative  . Not on file   Social Determinants of Health   Financial Resource Strain:   . Difficulty of Paying Living Expenses:   Food Insecurity:   . Worried About Charity fundraiser in the Last Year:   . Arboriculturist in the Last Year:   Transportation Needs:   . Film/video editor (Medical):   Marland Kitchen Lack of Transportation (Non-Medical):   Physical Activity:   . Days of Exercise per Week:   . Minutes of Exercise per Session:   Stress:   . Feeling of Stress :   Social Connections:   . Frequency of Communication with Friends and Family:   . Frequency of Social Gatherings with Friends and Family:   . Attends Religious Services:   . Active  Member of Clubs or Organizations:   . Attends Archivist Meetings:   Marland Kitchen Marital Status:   Intimate Partner Violence:   . Fear of Current or Ex-Partner:   . Emotionally Abused:   Marland Kitchen Physically Abused:   . Sexually Abused:     Outpatient Medications Prior to Visit  Medication Sig Dispense Refill  . amLODipine (NORVASC) 10 MG tablet     . Ascorbic Acid (VITAMIN C GUMMIE PO) Take by mouth.    Marland Kitchen aspirin 81 MG tablet Take 81 mg by mouth daily.    Marland Kitchen atorvastatin (LIPITOR) 40 MG tablet     . Cinnamon 500 MG TABS Take by mouth.    . metFORMIN (GLUCOPHAGE) 1000 MG tablet     . NOVOLIN 70/30 RELION (70-30) 100 UNIT/ML injection     . quinapril (ACCUPRIL) 40 MG tablet     . TRULICITY 1.5 QT/6.2UQ SOPN     . vitamin B-12 (CYANOCOBALAMIN) 100 MCG tablet Take 100 mcg by mouth daily.    . TRULICITY 3.33 LK/5.6YB SOPN INJECT 1 PEN SUBCUTANEOUSLY ONCE A WEEK     No facility-administered  medications prior to visit.    Allergies  Allergen Reactions  . Iodine Hives  . Morphine     REACTION: severe nausea/vomiting    ROS Review of Systems  Constitutional: Negative for activity change and appetite change.  HENT: Negative for congestion, ear pain and sinus pain.   Eyes: Negative for pain.  Respiratory: Positive for apnea and shortness of breath. Negative for cough and chest tightness.   Cardiovascular: Negative for chest pain and palpitations.  Gastrointestinal: Negative for abdominal distention and nausea.  Genitourinary: Negative for difficulty urinating.  Musculoskeletal: Negative for arthralgias and myalgias.  Skin: Negative for rash.  Neurological: Negative for light-headedness.  Psychiatric/Behavioral: The patient is not nervous/anxious.       Objective:    Physical Exam  Constitutional: He is oriented to person, place, and time. He appears well-developed and well-nourished.  HENT:  Right Ear: External ear normal.  Left Ear: External ear normal.  Mouth/Throat: Oropharynx is clear and moist.  Eyes: Conjunctivae are normal.  Cardiovascular: Normal rate, regular rhythm and normal heart sounds.  Pulmonary/Chest: Breath sounds normal. No respiratory distress. He exhibits no tenderness.  Sleep apnea. CPAP at night  Abdominal: Bowel sounds are normal.  Musculoskeletal:     Cervical back: Normal range of motion and neck supple.  Neurological: He is alert and oriented to person, place, and time.  Skin: Skin is warm. No rash noted.  Psychiatric: He has a normal mood and affect.    BP 140/62 (BP Location: Left Arm, Patient Position: Sitting)   Pulse 82   Temp 98.8 F (37.1 C) (Temporal)   Ht 5' 4.5" (1.638 m)   Wt 227 lb (103 kg)   SpO2 97%   BMI 38.36 kg/m  Wt Readings from Last 3 Encounters:  07/01/19 227 lb (103 kg)  12/13/18 228 lb (103.4 kg)  11/26/18 228 lb 3.2 oz (103.5 kg)     Health Maintenance Due  Topic Date Due  . HEMOGLOBIN A1C   Never done  . Hepatitis C Screening  Never done  . FOOT EXAM  Never done  . OPHTHALMOLOGY EXAM  Never done  . TETANUS/TDAP  Never done  . PNA vac Low Risk Adult (2 of 2 - PCV13) 12/18/2013    There are no preventive care reminders to display for this patient.  No results found for: TSH No  results found for: WBC, HGB, HCT, MCV, PLT No results found for: NA, K, CHLORIDE, CO2, GLUCOSE, BUN, CREATININE, BILITOT, ALKPHOS, AST, ALT, PROT, ALBUMIN, CALCIUM, ANIONGAP, EGFR, GFR No results found for: CHOL No results found for: HDL No results found for: LDLCALC No results found for: TRIG No results found for: CHOLHDL No results found for: HGBA1C    Assessment & Plan:  Obstructive sleep apnea Well controlled.  No changes to medicines. Pt will need a new CPAP Machines  New machine ordered by [facility] management  Problem List Items Addressed This Visit      Respiratory   Obstructive sleep apnea - Primary    Well controlled.  No changes to medicines. Pt will need a new CPAP Machines  New machine ordered by [facility] management         No orders of the defined types were placed in this encounter.   Follow-up: Return if symptoms worsen or fail to improve.    Ivy Lynn, NP

## 2019-07-01 NOTE — Patient Instructions (Addendum)
Obstructive sleep apnea Well controlled.  No changes to medicines. Pt will need a new CPAP Machines  Continue to work on eating a healthy diet and exercise.      CPAP and BPAP Information CPAP and BPAP are methods of helping a person breathe with the use of air pressure. CPAP stands for "continuous positive airway pressure." BPAP stands for "bi-level positive airway pressure." In both methods, air is blown through your nose or mouth and into your air passages to help you breathe well. CPAP and BPAP use different amounts of pressure to blow air. With CPAP, the amount of pressure stays the same while you breathe in and out. With BPAP, the amount of pressure is increased when you breathe in (inhale) so that you can take larger breaths. Your health care provider will recommend whether CPAP or BPAP would be more helpful for you. Why are CPAP and BPAP treatments used? CPAP or BPAP can be helpful if you have:  Sleep apnea.  Chronic obstructive pulmonary disease (COPD).  Heart failure.  Medical conditions that weaken the muscles of the chest including muscular dystrophy, or neurological diseases such as amyotrophic lateral sclerosis (ALS).  Other problems that cause breathing to be weak, abnormal, or difficult. CPAP is most commonly used for obstructive sleep apnea (OSA) to keep the airways from collapsing when the muscles relax during sleep. How is CPAP or BPAP administered? Both CPAP and BPAP are provided by a small machine with a flexible plastic tube that attaches to a plastic mask. You wear the mask. Air is blown through the mask into your nose or mouth. The amount of pressure that is used to blow the air can be adjusted on the machine. Your health care provider will determine the pressure setting that should be used based on your individual needs. When should CPAP or BPAP be used? In most cases, the mask only needs to be worn during sleep. Generally, the mask needs to be worn throughout  the night and during any daytime naps. People with certain medical conditions may also need to wear the mask at other times when they are awake. Follow instructions from your health care provider about when to use the machine. What are some tips for using the mask?   Because the mask needs to be snug, some people feel trapped or closed-in (claustrophobic) when first using the mask. If you feel this way, you may need to get used to the mask. One way to do this is by holding the mask loosely over your nose or mouth and then gradually applying the mask more snugly. You can also gradually increase the amount of time that you use the mask.  Masks are available in various types and sizes. Some fit over your mouth and nose while others fit over just your nose. If your mask does not fit well, talk with your health care provider about getting a different one.  If you are using a mask that fits over your nose and you tend to breathe through your mouth, a chin strap may be applied to help keep your mouth closed.  The CPAP and BPAP machines have alarms that may sound if the mask comes off or develops a leak.  If you have trouble with the mask, it is very important that you talk with your health care provider about finding a way to make the mask easier to tolerate. Do not stop using the mask. Stopping the use of the mask could have a negative impact  on your health. What are some tips for using the machine?  Place your CPAP or BPAP machine on a secure table or stand near an electrical outlet.  Know where the on/off switch is located on the machine.  Follow instructions from your health care provider about how to set the pressure on your machine and when you should use it.  Do not eat or drink while the CPAP or BPAP machine is on. Food or fluids could get pushed into your lungs by the pressure of the CPAP or BPAP.  Do not smoke. Tobacco smoke residue can damage the machine.  For home use, CPAP and BPAP  machines can be rented or purchased through home health care companies. Many different brands of machines are available. Renting a machine before purchasing may help you find out which particular machine works well for you.  Keep the CPAP or BPAP machine and attachments clean. Ask your health care provider for specific instructions. Get help right away if:  You have redness or open areas around your nose or mouth where the mask fits.  You have trouble using the CPAP or BPAP machine.  You cannot tolerate wearing the CPAP or BPAP mask.  You have pain, discomfort, and bloating in your abdomen. Summary  CPAP and BPAP are methods of helping a person breathe with the use of air pressure.  Both CPAP and BPAP are provided by a small machine with a flexible plastic tube that attaches to a plastic mask.  If you have trouble with the mask, it is very important that you talk with your health care provider about finding a way to make the mask easier to tolerate. This information is not intended to replace advice given to you by your health care provider. Make sure you discuss any questions you have with your health care provider. Document Revised: 07/17/2018 Document Reviewed: 02/14/2016 Elsevier Patient Education  [place].

## 2019-07-01 NOTE — Assessment & Plan Note (Addendum)
Well controlled.  No changes to medicines. Pt will need a new CPAP Machines  New machine ordered by [facility] management

## 2019-07-07 DIAGNOSIS — H903 Sensorineural hearing loss, bilateral: Secondary | ICD-10-CM | POA: Insufficient documentation

## 2019-07-07 DIAGNOSIS — H9313 Tinnitus, bilateral: Secondary | ICD-10-CM | POA: Diagnosis not present

## 2019-07-07 HISTORY — DX: Sensorineural hearing loss, bilateral: H90.3

## 2019-07-11 DIAGNOSIS — G4733 Obstructive sleep apnea (adult) (pediatric): Secondary | ICD-10-CM | POA: Diagnosis not present

## 2019-07-17 DIAGNOSIS — Z5181 Encounter for therapeutic drug level monitoring: Secondary | ICD-10-CM | POA: Diagnosis not present

## 2019-07-17 DIAGNOSIS — E1142 Type 2 diabetes mellitus with diabetic polyneuropathy: Secondary | ICD-10-CM | POA: Diagnosis not present

## 2019-07-17 DIAGNOSIS — Z794 Long term (current) use of insulin: Secondary | ICD-10-CM | POA: Diagnosis not present

## 2019-07-17 DIAGNOSIS — E1122 Type 2 diabetes mellitus with diabetic chronic kidney disease: Secondary | ICD-10-CM | POA: Diagnosis not present

## 2019-07-17 DIAGNOSIS — N182 Chronic kidney disease, stage 2 (mild): Secondary | ICD-10-CM | POA: Diagnosis not present

## 2019-07-17 DIAGNOSIS — Z7189 Other specified counseling: Secondary | ICD-10-CM | POA: Diagnosis not present

## 2019-07-17 DIAGNOSIS — E669 Obesity, unspecified: Secondary | ICD-10-CM | POA: Diagnosis not present

## 2019-07-29 ENCOUNTER — Encounter: Payer: Self-pay | Admitting: Sports Medicine

## 2019-07-29 ENCOUNTER — Ambulatory Visit (INDEPENDENT_AMBULATORY_CARE_PROVIDER_SITE_OTHER): Payer: Medicare HMO | Admitting: Sports Medicine

## 2019-07-29 ENCOUNTER — Other Ambulatory Visit: Payer: Self-pay

## 2019-07-29 DIAGNOSIS — M21611 Bunion of right foot: Secondary | ICD-10-CM

## 2019-07-29 DIAGNOSIS — E119 Type 2 diabetes mellitus without complications: Secondary | ICD-10-CM | POA: Diagnosis not present

## 2019-07-29 DIAGNOSIS — M2042 Other hammer toe(s) (acquired), left foot: Secondary | ICD-10-CM

## 2019-07-29 DIAGNOSIS — M2011 Hallux valgus (acquired), right foot: Secondary | ICD-10-CM | POA: Diagnosis not present

## 2019-07-29 DIAGNOSIS — M2142 Flat foot [pes planus] (acquired), left foot: Secondary | ICD-10-CM | POA: Diagnosis not present

## 2019-07-29 DIAGNOSIS — M2041 Other hammer toe(s) (acquired), right foot: Secondary | ICD-10-CM

## 2019-07-29 DIAGNOSIS — M2141 Flat foot [pes planus] (acquired), right foot: Secondary | ICD-10-CM

## 2019-07-29 DIAGNOSIS — L84 Corns and callosities: Secondary | ICD-10-CM

## 2019-07-29 NOTE — Progress Notes (Signed)
Subjective: Johnathan Wall is a 74 y.o. male patient with history of diabetes who presents to office today for diabetic foot exam.  Patient denies any pain or symptoms but reports that he can tell when it is time for new shoes because he gets some rubbing at the fifth toe as well as rubbing at the second toe and as soon as he gets new shoes the issue with the rubbing does a way and the corn goes away.  Patient reports that his blood sugar this morning was 160 last A1c 7 and will see his PCP again on tomorrow.  Patient does admit to some baseline numbness and tingling but otherwise denies any other pedal complaints at this time.  Patient Active Problem List   Diagnosis Date Noted  . Radial styloid tenosynovitis of right hand 03/13/2018  . Pain of right thumb 03/13/2018  . Deviated septum 09/18/2017  . Neck mass 09/18/2017  . Obstructive sleep apnea 09/18/2017  . Tinnitus, bilateral 09/18/2017   Current Outpatient Medications on File Prior to Visit  Medication Sig Dispense Refill  . amLODipine (NORVASC) 10 MG tablet     . Ascorbic Acid (VITAMIN C GUMMIE PO) Take by mouth.    Marland Kitchen aspirin 81 MG tablet Take 81 mg by mouth daily.    Marland Kitchen atorvastatin (LIPITOR) 40 MG tablet     . Cinnamon 500 MG TABS Take by mouth.    . metFORMIN (GLUCOPHAGE) 1000 MG tablet     . NOVOLIN 70/30 RELION (70-30) 100 UNIT/ML injection     . quinapril (ACCUPRIL) 40 MG tablet     . TRULICITY 1.5 0000000 SOPN     . vitamin B-12 (CYANOCOBALAMIN) 100 MCG tablet Take 100 mcg by mouth daily.     No current facility-administered medications on file prior to visit.   Allergies  Allergen Reactions  . Iodine Hives  . Morphine     REACTION: severe nausea/vomiting    No results found for this or any previous visit (from the past 2160 hour(s)).  Objective: General: Patient is awake, alert, and oriented x 3 and in no acute distress.  Integument: Skin is warm, dry and supple bilateral. Nails are short and mildly thickened 1  through 5 bilateral.  Very minimal corn noted at the medial aspect of the left fifth toe.  Small scrape or abrasion noted at the dorsal aspect of the left second toe. Remaining integument unremarkable.  Vasculature:  Dorsalis Pedis pulse 1/4 bilateral. Posterior Tibial pulse 1/4 bilateral.  Capillary fill time <3 sec 1-5 bilateral. Positive hair growth to the level of the digits. Temperature gradient within normal limits. No varicosities present bilateral. No edema present bilateral.   Neurology: The patient has intact sensation measured with a 5.07/10g Semmes Weinstein Monofilament at all pedal sites bilateral . Vibratory sensation diminished bilateral with tuning fork. No Babinski sign present bilateral.   Musculoskeletal: Asymptomatic bunion and hammertoe with varus rotation at the left fifth toe pedal deformities noted.  Muscular strength 5/5 in all lower extremity muscular groups bilateral without pain on range of motion . No tenderness with calf compression bilateral.  Assessment and Plan: Problem List Items Addressed This Visit    None    Visit Diagnoses    Encounter for diabetic foot exam ([place])    -  Primary   Hallux valgus with bunions of right foot       Hammer toes of both feet       Pes planus of both feet  Corns and callosities          -Examined patient. -Discussed and educated patient on diabetic foot care, especially with  regards to the vascular, neurological and musculoskeletal systems.  -Diabetic foot exam performed -Recommend to continue with diabetic shoes to prevent pedal problems -Advised patient to avoid self trimming and to return to office for routine foot care especially avoid trimming with a blade at any callus areas -Patient advised to call the office if any problems or questions arise in the meantime. -Return to office as scheduled.  Landis Martins, DPM

## 2019-07-30 DIAGNOSIS — N182 Chronic kidney disease, stage 2 (mild): Secondary | ICD-10-CM | POA: Diagnosis not present

## 2019-07-30 DIAGNOSIS — E1142 Type 2 diabetes mellitus with diabetic polyneuropathy: Secondary | ICD-10-CM | POA: Diagnosis not present

## 2019-07-30 DIAGNOSIS — E1122 Type 2 diabetes mellitus with diabetic chronic kidney disease: Secondary | ICD-10-CM | POA: Diagnosis not present

## 2019-07-30 DIAGNOSIS — E669 Obesity, unspecified: Secondary | ICD-10-CM | POA: Diagnosis not present

## 2019-07-30 DIAGNOSIS — Z794 Long term (current) use of insulin: Secondary | ICD-10-CM | POA: Diagnosis not present

## 2019-07-30 DIAGNOSIS — Z5181 Encounter for therapeutic drug level monitoring: Secondary | ICD-10-CM | POA: Diagnosis not present

## 2019-08-10 DIAGNOSIS — G4733 Obstructive sleep apnea (adult) (pediatric): Secondary | ICD-10-CM | POA: Diagnosis not present

## 2019-08-22 DIAGNOSIS — G4733 Obstructive sleep apnea (adult) (pediatric): Secondary | ICD-10-CM | POA: Diagnosis not present

## 2019-09-10 DIAGNOSIS — G4733 Obstructive sleep apnea (adult) (pediatric): Secondary | ICD-10-CM | POA: Diagnosis not present

## 2019-09-17 ENCOUNTER — Ambulatory Visit (INDEPENDENT_AMBULATORY_CARE_PROVIDER_SITE_OTHER): Payer: Medicare HMO

## 2019-09-17 ENCOUNTER — Other Ambulatory Visit: Payer: Self-pay

## 2019-09-17 VITALS — BP 128/62 | HR 81 | Temp 98.4°F | Resp 16 | Ht 63.0 in | Wt 223.5 lb

## 2019-09-17 DIAGNOSIS — Z0000 Encounter for general adult medical examination without abnormal findings: Secondary | ICD-10-CM

## 2019-09-18 DIAGNOSIS — E1142 Type 2 diabetes mellitus with diabetic polyneuropathy: Secondary | ICD-10-CM

## 2019-09-18 DIAGNOSIS — E1121 Type 2 diabetes mellitus with diabetic nephropathy: Secondary | ICD-10-CM

## 2019-09-18 DIAGNOSIS — I10 Essential (primary) hypertension: Secondary | ICD-10-CM | POA: Insufficient documentation

## 2019-09-18 DIAGNOSIS — E669 Obesity, unspecified: Secondary | ICD-10-CM | POA: Insufficient documentation

## 2019-09-18 DIAGNOSIS — N182 Chronic kidney disease, stage 2 (mild): Secondary | ICD-10-CM | POA: Insufficient documentation

## 2019-09-18 DIAGNOSIS — E785 Hyperlipidemia, unspecified: Secondary | ICD-10-CM

## 2019-09-18 HISTORY — DX: Obesity, unspecified: E66.9

## 2019-09-18 HISTORY — DX: Type 2 diabetes mellitus with diabetic polyneuropathy: E11.42

## 2019-09-18 HISTORY — DX: Chronic kidney disease, stage 2 (mild): N18.2

## 2019-09-18 HISTORY — DX: Hyperlipidemia, unspecified: E78.5

## 2019-09-18 HISTORY — DX: Type 2 diabetes mellitus with diabetic nephropathy: E11.21

## 2019-09-18 NOTE — Progress Notes (Signed)
Subjective:   Johnathan Wall is a 73 y.o. male who presents for Medicare Annual/Subsequent preventive examination.  This wellness visit is conducted by a nurse.  The patient's medications were reviewed and reconciled since the patient's last visit.  History details were provided by the patient.  The history appears to be reliable.    Patient's last AWV was >5 years ago.   Medical History: Patient history and Family history was reviewed  Medications, Allergies, and preventative health maintenance was reviewed and updated.   Review of Systems:  Review of Systems  Constitutional: Negative.   HENT: Positive for hearing loss.   Eyes: Negative.   Respiratory: Positive for apnea. Negative for cough, chest tightness and shortness of breath.        Compliant with CPAP  Cardiovascular: Negative.  Negative for chest pain and palpitations.  Gastrointestinal: Negative.   Genitourinary: Negative.   Musculoskeletal: Positive for arthralgias.       Right knee pain  Neurological: Positive for numbness. Negative for dizziness and weakness.       Neuropathy  Psychiatric/Behavioral: Negative for confusion and suicidal ideas. The patient is not nervous/anxious.          Objective:    Vitals: BP 128/62 (BP Location: Left Arm, Patient Position: Sitting, Cuff Size: Large)   Pulse 81   Temp 98.4 F (36.9 C) (Temporal)   Resp 16   Ht 5\' 3"  (1.6 m)   Wt 223 lb 8 oz (101.4 kg)   SpO2 97%   BMI 39.59 kg/m   Body mass index is 39.59 kg/m.  Advanced Directives 09/17/2019  Does Patient Have a Medical Advance Directive? Yes  Type of Paramedic of [place];Living will  Does patient want to make changes to medical advance directive? No - Patient declined  Copy of [place] in Chart? No - copy requested    Tobacco Social History   Tobacco Use  Smoking Status Never Smoker  Smokeless Tobacco Never Used     Counseling given: Not Answered   Clinical  Intake:  Pre-visit preparation completed: Yes  Pain : No/denies pain Pain Score: 0-No pain     BMI - recorded: 39.5 Nutritional Status: BMI > 30  Obese Nutritional Risks: None Diabetes: Yes CBG done?: No Did pt. bring in CBG monitor from home?: No  How often do you need to have someone help you when you read instructions, pamphlets, or other written materials from your doctor or pharmacy?: 1 - Never  Interpreter Needed?: No     Past Medical History:  Diagnosis Date  . Diabetes mellitus without complication ([place])   . Hypertension   . Mixed hyperlipidemia   . Obstructive sleep apnea (adult) (pediatric)   . Sleep apnea    cpap  . Type 2 diabetes mellitus with diabetic chronic kidney disease ([place])   . Type 2 diabetes mellitus with diabetic polyneuropathy [facility])    Past Surgical History:  Procedure Laterality Date  . COLONOSCOPY    . FOOT SURGERY    . KNEE ARTHROPLASTY     right  . POLYPECTOMY    . SHOULDER ARTHROSCOPY W/ ROTATOR CUFF REPAIR     bilateral   Family History  Problem Relation Age of Onset  . Diabetes type II Mother   . Heart attack Brother   . Colon cancer Neg Hx   . Colon polyps Neg Hx   . Esophageal cancer Neg Hx   . Rectal cancer Neg Hx   .  Stomach cancer Neg Hx    Social History   Socioeconomic History  . Marital status: Married    Spouse name: Not on file  . Number of children: 1  . Years of education: Not on file  . Highest education level: Not on file  Occupational History  . Occupation: Retired  Tobacco Use  . Smoking status: Never Smoker  . Smokeless tobacco: Never Used  Vaping Use  . Vaping Use: Never used  Substance and Sexual Activity  . Alcohol use: Yes    Alcohol/week: 0.0 standard drinks    Comment: occasional beer  . Drug use: No  . Sexual activity: Not on file  Other Topics Concern  . Not on file  Social History Narrative  . Not on file   Social Determinants of Health   Financial Resource Strain:   . Difficulty  of Paying Living Expenses:   Food Insecurity:   . Worried About Charity fundraiser in the Last Year:   . Arboriculturist in the Last Year:   Transportation Needs:   . Film/video editor (Medical):   Marland Kitchen Lack of Transportation (Non-Medical):   Physical Activity:   . Days of Exercise per Week:   . Minutes of Exercise per Session:   Stress:   . Feeling of Stress :   Social Connections:   . Frequency of Communication with Friends and Family:   . Frequency of Social Gatherings with Friends and Family:   . Attends Religious Services:   . Active Member of Clubs or Organizations:   . Attends Archivist Meetings:   Marland Kitchen Marital Status:     Outpatient Encounter Medications as of 09/17/2019  Medication Sig  . amLODipine (NORVASC) 10 MG tablet   . Apoaequorin (PREVAGEN PO) Take 1 tablet by mouth daily.  . Ascorbic Acid (VITAMIN C GUMMIE PO) Take by mouth.  Marland Kitchen aspirin 81 MG tablet Take 81 mg by mouth daily.  Marland Kitchen atorvastatin (LIPITOR) 40 MG tablet   . metFORMIN (GLUCOPHAGE) 1000 MG tablet   . NOVOLIN 70/30 RELION (70-30) 100 UNIT/ML injection   . quinapril (ACCUPRIL) 40 MG tablet   . TRULICITY 1.5 JG/2.8ZM SOPN   . vitamin B-12 (CYANOCOBALAMIN) 100 MCG tablet Take 100 mcg by mouth daily.  . Cinnamon 500 MG TABS Take by mouth.   No facility-administered encounter medications on file as of 09/17/2019.    Activities of Daily Living In your present state of health, do you have any difficulty performing the following activities: 09/17/2019  Hearing? Y  Vision? N  Difficulty concentrating or making decisions? N  Walking or climbing stairs? Y  Dressing or bathing? N  Doing errands, shopping? N  Preparing Food and eating ? N  Using the Toilet? N  In the past six months, have you accidently leaked urine? N  Do you have problems with loss of bowel control? N  Managing your Medications? N  Managing your Finances? N  Housekeeping or managing your Housekeeping? N  Some recent data might  be hidden    Patient Care Team: Rochel Brome, MD as PCP - General (Family Medicine)   Assessment:   This is a routine wellness examination for Johnathan Wall.  Exercise Activities and Dietary recommendations Current Exercise Habits: Structured exercise class, Type of exercise: walking, Time (Minutes): 10, Intensity: Mild, Exercise limited by: orthopedic condition(s)  Goals    . HEMOGLOBIN A1C < 7     Treated by Dr Buddy Duty, Endocrinology  Fall Risk Fall Risk  09/17/2019  Falls in the past year? 0  Number falls in past yr: 0  Injury with Fall? 0  Risk for fall due to : Orthopedic patient  Follow up Falls evaluation completed;Falls prevention discussed   Is the patient's home free of loose throw rugs in walkways, pet beds, electrical cords, etc?   yes      Grab bars in the bathroom? no      Handrails on the stairs?   yes      Adequate lighting?   yes   Depression Screen PHQ 2/9 Scores 09/17/2019 07/01/2019  PHQ - 2 Score 0 0    Cognitive Function     6CIT Screen 09/18/2019  What Year? 0 points  What month? 0 points  What time? 0 points  Count back from 20 0 points  Months in reverse 0 points  Repeat phrase 2 points  Total Score 2    Immunization History  Administered Date(s) Administered  . Influenza Split 01/09/2011, 01/01/2012, 12/19/2012, 01/21/2014, 01/09/2015  . Influenza, High Dose Seasonal PF 01/04/2016, 12/26/2017, 01/14/2019  . Influenza-Unspecified 12/09/2017  . Moderna SARS-COVID-2 Vaccination 04/27/2019  . PFIZER SARS-COV-2 Vaccination 05/17/2019  . Pneumococcal Conjugate-13 09/30/2014  . Pneumococcal Polysaccharide-23 04/10/2005, 05/09/2011, 12/18/2012   Screening Tests Health Maintenance  Topic Date Due  . HEMOGLOBIN A1C (Endocrinology) 11/2019   . FOOT EXAM  07/29/2020  . OPHTHALMOLOGY EXAM  Never done  . TETANUS/TDAP  Never done  . INFLUENZA VACCINE  11/09/2019  . COLONOSCOPY  12/12/2021  . COVID-19 Vaccine  Completed  . PNA vac Low Risk Adult   Completed   Cancer Screenings: Lung: Low Dose CT Chest recommended if Age 24-80 years, 30 pack-year currently smoking OR have quit w/in 15years. Patient does not qualify. Colorectal: Done in 2020       Plan:    Counseling was provided today regarding the following topics: healthy eating habits, home safety, vitamin and mineral supplementation (Multivitiman), regular exercise, tobacco avoidance, limitation of alcohol intake, use of seat belts, firearm safety, and fall prevention.  Annual recommendations include: influenza vaccine, dental cleanings, and eye exams.  Lab results requested from endocrinologist (Dr Buddy Duty)   I have personally reviewed and noted the following in the patient's chart:   . Medical and social history . Use of alcohol, tobacco or illicit drugs  . Current medications and supplements . Functional ability and status . Nutritional status . Physical activity . Advanced directives . List of other physicians . Hospitalizations, surgeries, and ER visits in previous 12 months . Vitals . Screenings to include cognitive, depression, and falls . Referrals and appointments  In addition, I have reviewed and discussed with patient certain preventive protocols, quality metrics, and best practice recommendations. A written personalized care plan for preventive services as well as general preventive health recommendations were provided to patient.     Erie Noe, LPN  [id number]

## 2019-09-18 NOTE — Patient Instructions (Addendum)
 Fall Prevention in the Home, Adult Falls can cause injuries. They can happen to people of all ages. There are many things you can do to make your home safe and to help prevent falls. Ask for help when making these changes, if needed. What actions can I take to prevent falls? General Instructions  Use good lighting in all rooms. Replace any light bulbs that burn out.  Turn on the lights when you go into a dark area. Use night-lights.  Keep items that you use often in easy-to-reach places. Lower the shelves around your home if necessary.  Set up your furniture so you have a clear path. Avoid moving your furniture around.  Do not have throw rugs and other things on the floor that can make you trip.  Avoid walking on wet floors.  If any of your floors are uneven, fix them.  Add color or contrast paint or tape to clearly mark and help you see: ? Any grab bars or handrails. ? First and last steps of stairways. ? Where the edge of each step is.  If you use a stepladder: ? Make sure that it is fully opened. Do not climb a closed stepladder. ? Make sure that both sides of the stepladder are locked into place. ? Ask someone to hold the stepladder for you while you use it.  If there are any pets around you, be aware of where they are. What can I do in the bathroom?      Keep the floor dry. Clean up any water that spills onto the floor as soon as it happens.  Remove soap buildup in the tub or shower regularly.  Use non-skid mats or decals on the floor of the tub or shower.  Attach bath mats securely with double-sided, non-slip rug tape.  If you need to sit down in the shower, use a plastic, non-slip stool.  Install grab bars by the toilet and in the tub and shower. Do not use towel bars as grab bars. What can I do in the bedroom?  Make sure that you have a light by your bed that is easy to reach.  Do not use any sheets or blankets that are too big for your bed. They should  not hang down onto the floor.  Have a firm chair that has side arms. You can use this for support while you get dressed. What can I do in the kitchen?  Clean up any spills right away.  If you need to reach something above you, use a strong step stool that has a grab bar.  Keep electrical cords out of the way.  Do not use floor polish or wax that makes floors slippery. If you must use wax, use non-skid floor wax. What can I do with my stairs?  Do not leave any items on the stairs.  Make sure that you have a light switch at the top of the stairs and the bottom of the stairs. If you do not have them, ask someone to add them for you.  Make sure that there are handrails on both sides of the stairs, and use them. Fix handrails that are broken or loose. Make sure that handrails are as long as the stairways.  Install non-slip stair treads on all stairs in your home.  Avoid having throw rugs at the top or bottom of the stairs. If you do have throw rugs, attach them to the floor with carpet tape.  Choose a carpet that   does not hide the edge of the steps on the stairway.  Check any carpeting to make sure that it is firmly attached to the stairs. Fix any carpet that is loose or worn. What can I do on the outside of my home?  Use bright outdoor lighting.  Regularly fix the edges of walkways and driveways and fix any cracks.  Remove anything that might make you trip as you walk through a door, such as a raised step or threshold.  Trim any bushes or trees on the path to your home.  Regularly check to see if handrails are loose or broken. Make sure that both sides of any steps have handrails.  Install guardrails along the edges of any raised decks and porches.  Clear walking paths of anything that might make someone trip, such as tools or rocks.  Have any leaves, snow, or ice cleared regularly.  Use sand or salt on walking paths during winter.  Clean up any spills in your garage right  away. This includes grease or oil spills. What other actions can I take?  Wear shoes that: ? Have a low heel. Do not wear high heels. ? Have rubber bottoms. ? Are comfortable and fit you well. ? Are closed at the toe. Do not wear open-toe sandals.  Use tools that help you move around (mobility aids) if they are needed. These include: ? Canes. ? Walkers. ? Scooters. ? Crutches.  Review your medicines with your doctor. Some medicines can make you feel dizzy. This can increase your chance of falling. Ask your doctor what other things you can do to help prevent falls. Where to find more information  Centers for Disease Control and Prevention, [employer]AD[employer]: https://cdc.gov  National Institute on Aging: https://go4life.nia.nih.gov Contact a doctor if:  You are afraid of falling at home.  You feel weak, drowsy, or dizzy at home.  You fall at home. Summary  There are many simple things that you can do to make your home safe and to help prevent falls.  Ways to make your home safe include removing tripping hazards and installing grab bars in the bathroom.  Ask for help when making these changes in your home. This information is not intended to replace advice given to you by your health care provider. Make sure you discuss any questions you have with your health care provider. Document Revised: 07/18/2018 Document Reviewed: 11/09/2016 Elsevier Patient Education  2020 Elsevier Inc.   Health Maintenance, Male Adopting a healthy lifestyle and getting preventive care are important in promoting health and wellness. Ask your health care provider about:  The right schedule for you to have regular tests and exams.  Things you can do on your own to prevent diseases and keep yourself healthy. What should I know about diet, weight, and exercise? Eat a healthy diet   Eat a diet that includes plenty of vegetables, fruits, low-fat dairy products, and lean protein.  Do not eat a lot of foods  that are high in solid fats, added sugars, or sodium. Maintain a healthy weight Body mass index (BMI) is a measurement that can be used to identify possible weight problems. It estimates body fat based on height and weight. Your health care provider can help determine your BMI and help you achieve or maintain a healthy weight. Get regular exercise Get regular exercise. This is one of the most important things you can do for your health. Most adults should:  Exercise for at least 150 minutes each week. The   exercise should increase your heart rate and make you sweat (moderate-intensity exercise).  Do strengthening exercises at least twice a week. This is in addition to the moderate-intensity exercise.  Spend less time sitting. Even light physical activity can be beneficial. Watch cholesterol and blood lipids Have your blood tested for lipids and cholesterol at 76 years of age, then have this test every 5 years. You may need to have your cholesterol levels checked more often if:  Your lipid or cholesterol levels are high.  You are older than 76 years of age.  You are at high risk for heart disease. What should I know about cancer screening? Many types of cancers can be detected early and may often be prevented. Depending on your health history and family history, you may need to have cancer screening at various ages. This may include screening for:  Colorectal cancer.  Prostate cancer.  Skin cancer.  Lung cancer. What should I know about heart disease, diabetes, and high blood pressure? Blood pressure and heart disease  High blood pressure causes heart disease and increases the risk of stroke. This is more likely to develop in people who have high blood pressure readings, are of African descent, or are overweight.  Talk with your health care provider about your target blood pressure readings.  Have your blood pressure checked: ? Every 3-5 years if you are 18-39 years of  age. ? Every year if you are 40 years old or older.  If you are between the ages of 65 and 75 and are a current or former smoker, ask your health care provider if you should have a one-time screening for abdominal aortic aneurysm (AAA). Diabetes Have regular diabetes screenings. This checks your fasting blood sugar level. Have the screening done:  Once every three years after age 45 if you are at a normal weight and have a low risk for diabetes.  More often and at a younger age if you are overweight or have a high risk for diabetes. What should I know about preventing infection? Hepatitis B If you have a higher risk for hepatitis B, you should be screened for this virus. Talk with your health care provider to find out if you are at risk for hepatitis B infection. Hepatitis C Blood testing is recommended for:  Everyone born from 1945 through 1965.  Anyone with known risk factors for hepatitis C. Sexually transmitted infections (STIs)  You should be screened each year for STIs, including gonorrhea and chlamydia, if: ? You are sexually active and are younger than 76 years of age. ? You are older than 76 years of age and your health care provider tells you that you are at risk for this type of infection. ? Your sexual activity has changed since you were last screened, and you are at increased risk for chlamydia or gonorrhea. Ask your health care provider if you are at risk.  Ask your health care provider about whether you are at high risk for HIV. Your health care provider may recommend a prescription medicine to help prevent HIV infection. If you choose to take medicine to prevent HIV, you should first get tested for HIV. You should then be tested every 3 months for as long as you are taking the medicine. Follow these instructions at home: Lifestyle  Do not use any products that contain nicotine or tobacco, such as cigarettes, e-cigarettes, and chewing tobacco. If you need help quitting,  ask your health care provider.  Do not use street   drugs.  Do not share needles.  Ask your health care provider for help if you need support or information about quitting drugs. Alcohol use  Do not drink alcohol if your health care provider tells you not to drink.  If you drink alcohol: ? Limit how much you have to 0-2 drinks a day. ? Be aware of how much alcohol is in your drink. In the [place] one drink equals one 12 oz bottle of beer (355 mL), one 5 oz glass of wine (148 mL), or one 1 oz glass of hard liquor (44 mL). General instructions  Schedule regular health, dental, and eye exams.  Stay current with your vaccines.  Tell your health care provider if: ? You often feel depressed. ? You have ever been abused or do not feel safe at home. Summary  Adopting a healthy lifestyle and getting preventive care are important in promoting health and wellness.  Follow your health care provider's instructions about healthy diet, exercising, and getting tested or screened for diseases.  Follow your health care provider's instructions on monitoring your cholesterol and blood pressure. This information is not intended to replace advice given to you by your health care provider. Make sure you discuss any questions you have with your health care provider. Document Revised: 03/20/2018 Document Reviewed: 03/20/2018 Elsevier Patient Education  2020 [employer].   Johnathan Wall , Thank you for taking time to come for your Medicare Wellness Visit. I appreciate your ongoing commitment to your health goals. Please review the following plan we discussed and let me know if I can assist you in the future.   These are the goals we discussed: Goals    . HEMOGLOBIN A1C < 7     Treated by Dr Buddy Duty, Endocrinology       This is a list of the screening recommended for you and due dates:  Health Maintenance  Topic Date Due  . Hemoglobin A1C  Never done  .  Hepatitis C: One time screening is recommended  by Center for Disease Control  (CDC) for  adults born from [birth year] through 1967.   Never done  . Complete foot exam   Never done  . Eye exam for diabetics  Never done  . Tetanus Vaccine  Never done  . Flu Shot  11/09/2019  . Colon Cancer Screening  12/12/2021  . COVID-19 Vaccine  Completed  . Pneumonia vaccines  Completed

## 2019-10-03 ENCOUNTER — Ambulatory Visit (INDEPENDENT_AMBULATORY_CARE_PROVIDER_SITE_OTHER): Payer: Medicare HMO | Admitting: Orthotics

## 2019-10-03 DIAGNOSIS — M21611 Bunion of right foot: Secondary | ICD-10-CM | POA: Diagnosis not present

## 2019-10-03 DIAGNOSIS — M2041 Other hammer toe(s) (acquired), right foot: Secondary | ICD-10-CM

## 2019-10-03 DIAGNOSIS — M2011 Hallux valgus (acquired), right foot: Secondary | ICD-10-CM

## 2019-10-03 DIAGNOSIS — M2042 Other hammer toe(s) (acquired), left foot: Secondary | ICD-10-CM

## 2019-10-03 DIAGNOSIS — E119 Type 2 diabetes mellitus without complications: Secondary | ICD-10-CM

## 2019-10-10 DIAGNOSIS — G4733 Obstructive sleep apnea (adult) (pediatric): Secondary | ICD-10-CM | POA: Diagnosis not present

## 2019-10-31 NOTE — Progress Notes (Signed)

## 2019-11-10 DIAGNOSIS — G4733 Obstructive sleep apnea (adult) (pediatric): Secondary | ICD-10-CM | POA: Diagnosis not present

## 2019-12-08 DIAGNOSIS — D225 Melanocytic nevi of trunk: Secondary | ICD-10-CM | POA: Diagnosis not present

## 2019-12-08 DIAGNOSIS — L821 Other seborrheic keratosis: Secondary | ICD-10-CM | POA: Diagnosis not present

## 2019-12-08 DIAGNOSIS — D485 Neoplasm of uncertain behavior of skin: Secondary | ICD-10-CM | POA: Diagnosis not present

## 2019-12-08 DIAGNOSIS — L820 Inflamed seborrheic keratosis: Secondary | ICD-10-CM | POA: Diagnosis not present

## 2019-12-08 DIAGNOSIS — L570 Actinic keratosis: Secondary | ICD-10-CM | POA: Diagnosis not present

## 2019-12-11 DIAGNOSIS — G4733 Obstructive sleep apnea (adult) (pediatric): Secondary | ICD-10-CM | POA: Diagnosis not present

## 2020-01-10 DIAGNOSIS — G4733 Obstructive sleep apnea (adult) (pediatric): Secondary | ICD-10-CM | POA: Diagnosis not present

## 2020-01-12 DIAGNOSIS — E1142 Type 2 diabetes mellitus with diabetic polyneuropathy: Secondary | ICD-10-CM | POA: Diagnosis not present

## 2020-01-12 DIAGNOSIS — E669 Obesity, unspecified: Secondary | ICD-10-CM | POA: Diagnosis not present

## 2020-01-12 DIAGNOSIS — Z23 Encounter for immunization: Secondary | ICD-10-CM | POA: Diagnosis not present

## 2020-01-12 DIAGNOSIS — E1122 Type 2 diabetes mellitus with diabetic chronic kidney disease: Secondary | ICD-10-CM | POA: Diagnosis not present

## 2020-01-12 DIAGNOSIS — Z794 Long term (current) use of insulin: Secondary | ICD-10-CM | POA: Diagnosis not present

## 2020-01-12 DIAGNOSIS — M79604 Pain in right leg: Secondary | ICD-10-CM | POA: Diagnosis not present

## 2020-01-12 DIAGNOSIS — N182 Chronic kidney disease, stage 2 (mild): Secondary | ICD-10-CM | POA: Diagnosis not present

## 2020-01-21 DIAGNOSIS — M79604 Pain in right leg: Secondary | ICD-10-CM | POA: Diagnosis not present

## 2020-01-21 DIAGNOSIS — M79605 Pain in left leg: Secondary | ICD-10-CM | POA: Diagnosis not present

## 2020-02-10 DIAGNOSIS — G4733 Obstructive sleep apnea (adult) (pediatric): Secondary | ICD-10-CM | POA: Diagnosis not present

## 2020-03-11 DIAGNOSIS — G4733 Obstructive sleep apnea (adult) (pediatric): Secondary | ICD-10-CM | POA: Diagnosis not present

## 2020-03-12 DIAGNOSIS — G4733 Obstructive sleep apnea (adult) (pediatric): Secondary | ICD-10-CM | POA: Diagnosis not present

## 2020-04-11 DIAGNOSIS — G4733 Obstructive sleep apnea (adult) (pediatric): Secondary | ICD-10-CM | POA: Diagnosis not present

## 2020-04-21 DIAGNOSIS — N390 Urinary tract infection, site not specified: Secondary | ICD-10-CM | POA: Diagnosis not present

## 2020-04-21 DIAGNOSIS — Z1159 Encounter for screening for other viral diseases: Secondary | ICD-10-CM | POA: Diagnosis not present

## 2020-04-21 DIAGNOSIS — R319 Hematuria, unspecified: Secondary | ICD-10-CM | POA: Diagnosis not present

## 2020-04-21 DIAGNOSIS — Z0000 Encounter for general adult medical examination without abnormal findings: Secondary | ICD-10-CM | POA: Diagnosis not present

## 2020-04-21 DIAGNOSIS — E119 Type 2 diabetes mellitus without complications: Secondary | ICD-10-CM | POA: Diagnosis not present

## 2020-04-21 DIAGNOSIS — Z125 Encounter for screening for malignant neoplasm of prostate: Secondary | ICD-10-CM | POA: Diagnosis not present

## 2020-04-21 DIAGNOSIS — Z13220 Encounter for screening for lipoid disorders: Secondary | ICD-10-CM | POA: Diagnosis not present

## 2020-04-21 DIAGNOSIS — Z7689 Persons encountering health services in other specified circumstances: Secondary | ICD-10-CM | POA: Diagnosis not present

## 2020-05-12 DIAGNOSIS — G4733 Obstructive sleep apnea (adult) (pediatric): Secondary | ICD-10-CM | POA: Diagnosis not present

## 2020-06-09 DIAGNOSIS — G4733 Obstructive sleep apnea (adult) (pediatric): Secondary | ICD-10-CM | POA: Diagnosis not present

## 2020-07-10 DIAGNOSIS — G4733 Obstructive sleep apnea (adult) (pediatric): Secondary | ICD-10-CM | POA: Diagnosis not present

## 2020-07-13 DIAGNOSIS — E669 Obesity, unspecified: Secondary | ICD-10-CM | POA: Diagnosis not present

## 2020-07-13 DIAGNOSIS — Z794 Long term (current) use of insulin: Secondary | ICD-10-CM | POA: Diagnosis not present

## 2020-07-13 DIAGNOSIS — E1122 Type 2 diabetes mellitus with diabetic chronic kidney disease: Secondary | ICD-10-CM | POA: Diagnosis not present

## 2020-07-13 DIAGNOSIS — E1165 Type 2 diabetes mellitus with hyperglycemia: Secondary | ICD-10-CM | POA: Diagnosis not present

## 2020-07-13 DIAGNOSIS — E1142 Type 2 diabetes mellitus with diabetic polyneuropathy: Secondary | ICD-10-CM | POA: Diagnosis not present

## 2020-07-13 DIAGNOSIS — N182 Chronic kidney disease, stage 2 (mild): Secondary | ICD-10-CM | POA: Diagnosis not present

## 2020-12-07 DIAGNOSIS — L814 Other melanin hyperpigmentation: Secondary | ICD-10-CM | POA: Diagnosis not present

## 2020-12-07 DIAGNOSIS — D1801 Hemangioma of skin and subcutaneous tissue: Secondary | ICD-10-CM | POA: Diagnosis not present

## 2020-12-07 DIAGNOSIS — D225 Melanocytic nevi of trunk: Secondary | ICD-10-CM | POA: Diagnosis not present

## 2020-12-07 DIAGNOSIS — L570 Actinic keratosis: Secondary | ICD-10-CM | POA: Diagnosis not present

## 2020-12-07 DIAGNOSIS — L821 Other seborrheic keratosis: Secondary | ICD-10-CM | POA: Diagnosis not present

## 2021-01-12 DIAGNOSIS — E1122 Type 2 diabetes mellitus with diabetic chronic kidney disease: Secondary | ICD-10-CM | POA: Diagnosis not present

## 2021-01-12 DIAGNOSIS — Z794 Long term (current) use of insulin: Secondary | ICD-10-CM | POA: Diagnosis not present

## 2021-01-12 DIAGNOSIS — N182 Chronic kidney disease, stage 2 (mild): Secondary | ICD-10-CM | POA: Diagnosis not present

## 2021-01-12 DIAGNOSIS — E1142 Type 2 diabetes mellitus with diabetic polyneuropathy: Secondary | ICD-10-CM | POA: Diagnosis not present

## 2021-01-12 DIAGNOSIS — E669 Obesity, unspecified: Secondary | ICD-10-CM | POA: Diagnosis not present

## 2021-01-12 DIAGNOSIS — E1165 Type 2 diabetes mellitus with hyperglycemia: Secondary | ICD-10-CM | POA: Diagnosis not present

## 2021-01-12 DIAGNOSIS — Z23 Encounter for immunization: Secondary | ICD-10-CM | POA: Diagnosis not present

## 2021-02-01 ENCOUNTER — Ambulatory Visit: Payer: Medicare HMO | Admitting: Sports Medicine

## 2021-02-01 ENCOUNTER — Other Ambulatory Visit: Payer: Self-pay

## 2021-02-01 ENCOUNTER — Encounter: Payer: Self-pay | Admitting: Sports Medicine

## 2021-02-01 DIAGNOSIS — M2141 Flat foot [pes planus] (acquired), right foot: Secondary | ICD-10-CM

## 2021-02-01 DIAGNOSIS — M21611 Bunion of right foot: Secondary | ICD-10-CM | POA: Diagnosis not present

## 2021-02-01 DIAGNOSIS — M2142 Flat foot [pes planus] (acquired), left foot: Secondary | ICD-10-CM | POA: Diagnosis not present

## 2021-02-01 DIAGNOSIS — E119 Type 2 diabetes mellitus without complications: Secondary | ICD-10-CM

## 2021-02-01 DIAGNOSIS — M2041 Other hammer toe(s) (acquired), right foot: Secondary | ICD-10-CM

## 2021-02-01 DIAGNOSIS — L84 Corns and callosities: Secondary | ICD-10-CM

## 2021-02-01 DIAGNOSIS — M2011 Hallux valgus (acquired), right foot: Secondary | ICD-10-CM | POA: Diagnosis not present

## 2021-02-01 DIAGNOSIS — M2042 Other hammer toe(s) (acquired), left foot: Secondary | ICD-10-CM | POA: Diagnosis not present

## 2021-02-01 NOTE — Progress Notes (Signed)
Subjective: Johnathan Wall is a 75 y.o. male patient with history of diabetes who presents to office today for diabetic foot exam and states that he needs new diabetic shoes has a prescription from his PCP Dr. Buddy Duty.  Patient reports his blood sugar was 160 this morning and a last A1c of 7 and last visit to PCP 2 months ago.   Patient is assisted by wife this visit.  No other pedal complaints noted.  Patient Active Problem List   Diagnosis Date Noted   Chronic kidney disease, stage 2 (mild) 09/18/2019   Diabetic renal disease ([place]) 09/18/2019   Hyperlipidemia 09/18/2019   Hypertension 09/18/2019   Obesity 09/18/2019   Polyneuropathy due to type 2 diabetes mellitus ([place]) 09/18/2019   Bilateral sensorineural hearing loss 07/07/2019   Radial styloid tenosynovitis of right hand 03/13/2018   Pain of right thumb 03/13/2018   Deviated septum 09/18/2017   Neck mass 09/18/2017   Obstructive sleep apnea 09/18/2017   Tinnitus, bilateral 09/18/2017   Current Outpatient Medications on File Prior to Visit  Medication Sig Dispense Refill   amLODipine (NORVASC) 10 MG tablet      Apoaequorin (PREVAGEN PO) Take 1 tablet by mouth daily.     Ascorbic Acid (VITAMIN C GUMMIE PO) Take by mouth.     aspirin 81 MG tablet Take 81 mg by mouth daily.     atorvastatin (LIPITOR) 40 MG tablet      Cinnamon 500 MG TABS Take by mouth.     metFORMIN (GLUCOPHAGE) 1000 MG tablet      NOVOLIN 70/30 RELION (70-30) 100 UNIT/ML injection      quinapril (ACCUPRIL) 40 MG tablet      TRULICITY 1.5 DJ/2.4QA SOPN      vitamin B-12 (CYANOCOBALAMIN) 100 MCG tablet Take 100 mcg by mouth daily.     No current facility-administered medications on file prior to visit.   Allergies  Allergen Reactions   Contrast Media [Iodinated Diagnostic Agents]     Other reaction(s): hives   Empagliflozin     Other reaction(s): dizzy   Iodine Hives   Liraglutide     Other reaction(s): upset stomach   Morphine     REACTION: severe  nausea/vomiting    No results found for this or any previous visit (from the past 2160 hour(s)).  Objective: General: Patient is awake, alert, and oriented x 3 and in no acute distress.  Integument: Skin is warm, dry and supple bilateral. Nails are short and mildly thickened 1 through 5 bilateral.  Very minimal keratosis noted to first toes bilateral.  Vasculature:  Dorsalis Pedis pulse 1/4 bilateral. Posterior Tibial pulse 1/4 bilateral.  Capillary fill time <3 sec 1-5 bilateral. Positive hair growth to the level of the digits. Temperature gradient within normal limits. No varicosities present bilateral. No edema present bilateral.   Neurology: The patient has intact sensation measured with a 5.07/10g Semmes Weinstein Monofilament at all pedal sites bilateral . Vibratory sensation slightly diminished bilateral with tuning fork. No Babinski sign present bilateral.   Musculoskeletal: Asymptomatic bunion and hammertoe with varus rotation at the left fifth toe pedal deformities noted.  Muscular strength 5/5 in all lower extremity muscular groups bilateral without pain on range of motion . No tenderness with calf compression bilateral.  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 -Discussed and educated patient on diabetic foot care, especially with  regards to the vascular, neurological and musculoskeletal systems.  -Diabetic foot exam performed -Recommend to continue with diabetic shoes to prevent pedal problems; patient current shoes are worn and needs new diabetic shoes for the year -Advised patient to proceed with care with nail care and self trimming of callus areas and to avoid any sharp objects if he has difficulty with  caring for his feet may return for routine foot care as needed -Patient advised to call the office if any problems or questions arise in the meantime. -Return to office as scheduled for shoe measurements or sooner problems or issues arise.  Landis Martins, DPM

## 2021-02-02 ENCOUNTER — Ambulatory Visit (INDEPENDENT_AMBULATORY_CARE_PROVIDER_SITE_OTHER): Payer: Medicare HMO | Admitting: Sports Medicine

## 2021-02-02 DIAGNOSIS — M2041 Other hammer toe(s) (acquired), right foot: Secondary | ICD-10-CM

## 2021-02-02 DIAGNOSIS — M21611 Bunion of right foot: Secondary | ICD-10-CM

## 2021-02-02 DIAGNOSIS — M2011 Hallux valgus (acquired), right foot: Secondary | ICD-10-CM

## 2021-02-02 DIAGNOSIS — M2042 Other hammer toe(s) (acquired), left foot: Secondary | ICD-10-CM

## 2021-02-02 DIAGNOSIS — E119 Type 2 diabetes mellitus without complications: Secondary | ICD-10-CM

## 2021-02-02 NOTE — Progress Notes (Signed)
Patient presented for foam casting for 3 pair custom diabetic shoe inserts- Patient is measured with a brannock device to be a size 11 1/2 wide  Diabetic shoes are chosen from the [employer] catalog  The shoes chosen are 521  The patient will be contacted when the shoes and the inserts are ready to be picked up

## 2021-02-09 DIAGNOSIS — E119 Type 2 diabetes mellitus without complications: Secondary | ICD-10-CM | POA: Diagnosis not present

## 2021-02-22 DIAGNOSIS — R059 Cough, unspecified: Secondary | ICD-10-CM | POA: Diagnosis not present

## 2021-03-16 ENCOUNTER — Telehealth: Payer: Self-pay | Admitting: Sports Medicine

## 2021-03-16 NOTE — Telephone Encounter (Signed)
Pt left message wanting to know if his diabetic shoes are in..  I returned call and it goes directly to voicemail and left a message again that we have not received the needed paperwork from Dr Buddy Duty and it has been faxed 7 times including me faxing it by hand today. I suggested he call Dr Cindra Eves office and check with them to see if they can complete it.

## 2021-03-16 NOTE — Telephone Encounter (Signed)
Pts wife left message asking about status of pts diabetic shoes if they have came in yet.  I returned call after checking on order and we have not received the documents needed by medicare yet from Dr Buddy Duty and it has been faxed multiple times. I refaxed it today as well. She may want to call Dr Cindra Eves office and check with them. I asked them to call me back if needed.

## 2021-03-23 ENCOUNTER — Telehealth: Payer: Self-pay | Admitting: Sports Medicine

## 2021-03-23 NOTE — Telephone Encounter (Signed)
Pts wife left message asking if pts diabetic shoes are in..  I returned call and left message that we have not gotten the documents needed back from Dr Buddy Duty yet. It has been faxed multiple times including by me on 12.7.2022.

## 2021-05-05 ENCOUNTER — Telehealth: Payer: Self-pay | Admitting: Sports Medicine

## 2021-05-05 NOTE — Telephone Encounter (Signed)
Diabetic shoes/inserts in.. lvm for pt to call to schedule an appt to pick them up. Could be in [place] or [employer] office.

## 2021-05-16 ENCOUNTER — Other Ambulatory Visit: Payer: Self-pay

## 2021-05-16 ENCOUNTER — Ambulatory Visit (INDEPENDENT_AMBULATORY_CARE_PROVIDER_SITE_OTHER): Payer: Medicare HMO

## 2021-05-16 DIAGNOSIS — E1142 Type 2 diabetes mellitus with diabetic polyneuropathy: Secondary | ICD-10-CM | POA: Diagnosis not present

## 2021-05-16 DIAGNOSIS — M2042 Other hammer toe(s) (acquired), left foot: Secondary | ICD-10-CM

## 2021-05-16 DIAGNOSIS — L84 Corns and callosities: Secondary | ICD-10-CM

## 2021-05-16 DIAGNOSIS — M2142 Flat foot [pes planus] (acquired), left foot: Secondary | ICD-10-CM

## 2021-05-16 DIAGNOSIS — M2041 Other hammer toe(s) (acquired), right foot: Secondary | ICD-10-CM

## 2021-05-16 DIAGNOSIS — M21611 Bunion of right foot: Secondary | ICD-10-CM

## 2021-05-16 DIAGNOSIS — M2011 Hallux valgus (acquired), right foot: Secondary | ICD-10-CM | POA: Diagnosis not present

## 2021-05-16 DIAGNOSIS — M2141 Flat foot [pes planus] (acquired), right foot: Secondary | ICD-10-CM | POA: Diagnosis not present

## 2021-05-16 NOTE — Progress Notes (Signed)
SITUATION Reason for Visit: Fitting of Diabetic Shoes & Insoles Patient / Caregiver Report:  Patient is satisfied with fit and function  OBJECTIVE DATA: Patient History / Diagnosis:     ICD-10-CM   1. Polyneuropathy due to type 2 diabetes mellitus ([place])  E11.42     2. Hallux valgus with bunions of right foot  M20.11    M21.611     3. Hammer toes of both feet  M20.41    M20.42     4. Pes planus of both feet  M21.41    M21.42     5. Corns and callosities  L84       Change in Status:   None  ACTIONS PERFORMED: In-Person Delivery, patient was fit with: - 1x pair [id number] PDAC approved prefabricated Diabetic Shoes: [id number] 010 11.5W - 3x pair [id number] PDAC approved CAM milled custom diabetic insoles  Shoes and insoles were verified for structural integrity and safety. Patient wore shoes and insoles in office. Skin was inspected and free of areas of concern after wearing shoes and inserts. Shoes and inserts fit properly. Patient / Caregiver provided with ferbal instruction and demonstration regarding donning, doffing, wear, care, proper fit, function, purpose, cleaning, and use of shoes and insoles ' and in all related precautions and risks and benefits regarding shoes and insoles. Patient / Caregiver was instructed to wear properly fitting socks with shoes at all times. Patient was also provided with verbal instruction regarding how to report any failures or malfunctions of shoes or inserts, and necessary follow up care. Patient / Caregiver was also instructed to contact physician regarding change in status that may affect function of shoes and inserts.   Patient / Caregiver verbalized undersatnding of instruction provided. Patient / Caregiver demonstrated independence with proper donning and doffing of shoes and inserts.  PLAN Patient to follow up as needed. Plan of care was discussed with and agreed upon by patient and/or caregiver. All questions were answered and concerns addressed.

## 2021-07-13 DIAGNOSIS — E1122 Type 2 diabetes mellitus with diabetic chronic kidney disease: Secondary | ICD-10-CM | POA: Diagnosis not present

## 2021-07-13 DIAGNOSIS — E669 Obesity, unspecified: Secondary | ICD-10-CM | POA: Diagnosis not present

## 2021-07-13 DIAGNOSIS — Z794 Long term (current) use of insulin: Secondary | ICD-10-CM | POA: Diagnosis not present

## 2021-07-13 DIAGNOSIS — N182 Chronic kidney disease, stage 2 (mild): Secondary | ICD-10-CM | POA: Diagnosis not present

## 2021-07-13 DIAGNOSIS — E1142 Type 2 diabetes mellitus with diabetic polyneuropathy: Secondary | ICD-10-CM | POA: Diagnosis not present

## 2021-08-22 DIAGNOSIS — M25552 Pain in left hip: Secondary | ICD-10-CM | POA: Diagnosis not present

## 2021-10-31 ENCOUNTER — Encounter: Payer: Self-pay | Admitting: Gastroenterology

## 2021-11-10 DIAGNOSIS — M545 Low back pain, unspecified: Secondary | ICD-10-CM

## 2021-11-10 DIAGNOSIS — M25552 Pain in left hip: Secondary | ICD-10-CM

## 2021-11-19 DIAGNOSIS — M25552 Pain in left hip: Secondary | ICD-10-CM | POA: Diagnosis not present

## 2021-11-19 DIAGNOSIS — M545 Low back pain, unspecified: Secondary | ICD-10-CM | POA: Diagnosis not present

## 2021-11-25 DIAGNOSIS — M545 Low back pain, unspecified: Secondary | ICD-10-CM | POA: Diagnosis not present

## 2021-11-25 DIAGNOSIS — M25552 Pain in left hip: Secondary | ICD-10-CM | POA: Diagnosis not present

## 2021-12-20 ENCOUNTER — Ambulatory Visit: Payer: Medicare HMO | Admitting: Internal Medicine

## 2021-12-20 ENCOUNTER — Encounter: Payer: Self-pay | Admitting: Internal Medicine

## 2021-12-20 VITALS — BP 128/68 | HR 78 | Ht 63.0 in | Wt 230.2 lb

## 2021-12-20 DIAGNOSIS — Z86010 Personal history of colonic polyps: Secondary | ICD-10-CM

## 2021-12-20 NOTE — Progress Notes (Signed)
   Chief Complaint: Discuss colonoscopy  HPI : 76 year old male with history of DM, OSA, obesity presents to discuss colonoscopy  His last colonoscopy was 3 years ago and he was recommended for 3 year follow up for polyp surveillance. Denies use of blood tinners. He is on a baby aspirin. Denies blood in stools, changes in bowel habits, weight loss, ab pain, dysphagia, acid reflux. Denies family history of colon cancer.   Current Outpatient Medications  Medication Sig Dispense Refill   amLODipine (NORVASC) 10 MG tablet Take 10 mg by mouth daily.     Apoaequorin (PREVAGEN PO) Take 1 tablet by mouth daily.     Ascorbic Acid (VITAMIN C GUMMIE PO) Take 1 tablet by mouth daily at 2 PM.     aspirin 81 MG tablet Take 81 mg by mouth daily.     atorvastatin (LIPITOR) 40 MG tablet Take 40 mg by mouth daily.     metFORMIN (GLUCOPHAGE) 1000 MG tablet Take 1,000 mg by mouth 2 (two) times daily with a meal.     NOVOLIN 70/30 RELION (70-30) 100 UNIT/ML injection      quinapril (ACCUPRIL) 40 MG tablet Take 40 mg by mouth daily.     TRULICITY 1.5 OZ/2.2QM SOPN once a week.     vitamin B-12 (CYANOCOBALAMIN) 100 MCG tablet Take 100 mcg by mouth daily.     No current facility-administered medications for this visit.   Review of Systems: All systems reviewed and negative except where noted in HPI.   Physical Exam: BP 128/68   Pulse 78   Ht '5\' 3"'$  (1.6 m)   Wt 230 lb 4 oz (104.4 kg)   BMI 40.79 kg/m  Constitutional: Pleasant,well-developed, male in no acute distress. HEENT: Normocephalic and atraumatic. Conjunctivae are normal. No scleral icterus. Cardiovascular: Normal rate, regular rhythm.  Pulmonary/chest: Effort normal and breath sounds normal. No wheezing, rales or rhonchi. Abdominal: Soft, nondistended, nontender. Bowel sounds active throughout. There are no masses palpable. No hepatomegaly. Extremities: No edema Neurological: Alert and oriented to person place and time. Skin: Skin is warm and  dry. No rashes noted. Psychiatric: Normal mood and affect. Behavior is normal.  Colonoscopy 12/13/18: - Four 3 to 9 mm polyps in the descending colon and in the transverse colon, removed with a cold snare. Resected and retrieved. - One 14 mm polyp in the rectum, removed with a hot snare. Resected and retrieved. The resection site was tattooed to aid in future localization if necessary. - Left colon diverticulosis. - The examination was otherwise normal on direct and retroflexion views. Path: 1. Surgical [P], colon, descending and transverse, polyp (4) - TUBULAR ADENOMA(S). - HIGH GRADE DYSPLASIA IS NOT IDENTIFIED. 2. Surgical [P], colon, rectum, polyp - HYPERPLASTIC POLYP(S) WITH ULCERATION. - THERE IS NO EVIDENCE OF MALIGNANCY.  ASSESSMENT AND PLAN: History of colon polyps Patient presents to discuss a colonoscopy procedure. He is interested in continuing colonoscopies for polyp surveillance. His last colonoscopy was in 2020 with removal of 4 tubular adenomas.  - Colonoscopy [facility]. Miralax prep.   Christia Reading, MD

## 2021-12-20 NOTE — Patient Instructions (Addendum)
_______________________________________________________  If you are age 78 or older, your body mass index should be between 23-30. Your Body mass index is 40.79 kg/m. If this is out of the aforementioned range listed, please consider follow up with your Primary Care Provider. ________________________________________________________  The [facility] providers would like to encourage you to use [facility] to communicate with providers for non-urgent requests or questions.  Due to long hold times on the telephone, sending your provider a message by [facility] may be a faster and more efficient way to get a response.  Please allow 48 business hours for a response.  Please remember that this is for non-urgent requests.  _______________________________________________________  Dennis Bast have been scheduled for a colonoscopy. Please follow written instructions given to you at your visit today.  Please pick up your prep supplies at the pharmacy within the next 1-3 days. If you use inhalers (even only as needed), please bring them with you on the day of your procedure.  Due to recent changes in healthcare laws, you may see the results of your imaging and laboratory studies on MyChart before your provider has had a chance to review them.  We understand that in some cases there may be results that are confusing or concerning to you. Not all laboratory results come back in the same time frame and the provider may be waiting for multiple results in order to interpret others.  Please give [place] 48 hours in order for your provider to thoroughly review all the results before contacting the office for clarification of your results.   Thank you for entrusting me with your care and choosing [facility].  Dr Lorenso Courier

## 2021-12-25 ENCOUNTER — Encounter: Payer: Self-pay | Admitting: Certified Registered Nurse Anesthetist

## 2022-01-02 ENCOUNTER — Encounter: Payer: Self-pay | Admitting: Internal Medicine

## 2022-01-02 ENCOUNTER — Telehealth: Payer: Self-pay

## 2022-01-02 ENCOUNTER — Other Ambulatory Visit: Payer: Self-pay

## 2022-01-02 ENCOUNTER — Ambulatory Visit (AMBULATORY_SURGERY_CENTER): Payer: Medicare HMO | Admitting: Internal Medicine

## 2022-01-02 VITALS — BP 123/79 | HR 74 | Temp 97.8°F | Resp 13 | Ht 63.0 in | Wt 230.0 lb

## 2022-01-02 DIAGNOSIS — Z86010 Personal history of colonic polyps: Secondary | ICD-10-CM | POA: Diagnosis not present

## 2022-01-02 DIAGNOSIS — K635 Polyp of colon: Secondary | ICD-10-CM | POA: Diagnosis not present

## 2022-01-02 DIAGNOSIS — D128 Benign neoplasm of rectum: Secondary | ICD-10-CM

## 2022-01-02 DIAGNOSIS — D123 Benign neoplasm of transverse colon: Secondary | ICD-10-CM

## 2022-01-02 DIAGNOSIS — Z09 Encounter for follow-up examination after completed treatment for conditions other than malignant neoplasm: Secondary | ICD-10-CM

## 2022-01-02 DIAGNOSIS — G4733 Obstructive sleep apnea (adult) (pediatric): Secondary | ICD-10-CM | POA: Diagnosis not present

## 2022-01-02 DIAGNOSIS — D120 Benign neoplasm of cecum: Secondary | ICD-10-CM | POA: Diagnosis not present

## 2022-01-02 DIAGNOSIS — I469 Cardiac arrest, cause unspecified: Secondary | ICD-10-CM

## 2022-01-02 DIAGNOSIS — D122 Benign neoplasm of ascending colon: Secondary | ICD-10-CM | POA: Diagnosis not present

## 2022-01-02 DIAGNOSIS — E119 Type 2 diabetes mellitus without complications: Secondary | ICD-10-CM | POA: Diagnosis not present

## 2022-01-02 MED ORDER — SODIUM CHLORIDE 0.9 % IV SOLN: 500.0000 mL | Freq: Once | INTRAVENOUS | Status: DC

## 2022-01-02 NOTE — Progress Notes (Signed)
Called to room to assist during endoscopic procedure.  Patient ID and intended procedure confirmed with present staff. Received instructions for my participation in the procedure from the performing physician.  

## 2022-01-02 NOTE — Op Note (Addendum)
[place] Patient Name: Johnathan Wall Procedure Date: 01/02/2022 8:48 AM MRN: [id number] Endoscopist: Sonny Masters "Christia Reading ,  Age: 76 Referring MD:  Date of Birth: November 02, 1945 Gender: Male Account #: [id number] Procedure:                Colonoscopy Indications:              High risk colon cancer surveillance: Personal                            history of colonic polyps Medicines:                Monitored Anesthesia Care Procedure:                Pre-Anesthesia Assessment:                           - Prior to the procedure, a History and Physical                            was performed, and patient medications and                            allergies were reviewed. The patient's tolerance of                            previous anesthesia was also reviewed. The risks                            and benefits of the procedure and the sedation                            options and risks were discussed with the patient.                            All questions were answered, and informed consent                            was obtained. Prior Anticoagulants: The patient has                            taken no previous anticoagulant or antiplatelet                            agents. ASA Grade Assessment: III - A patient with                            severe systemic disease. After reviewing the risks                            and benefits, the patient was deemed in                            satisfactory condition to undergo the procedure.  After obtaining informed consent, the colonoscope                            was passed under direct vision. Throughout the                            procedure, the patient's blood pressure, pulse, and                            oxygen saturations were monitored continuously. The                            Colonoscope was introduced through the anus and                            advanced to the the terminal ileum.  The colonoscopy                            was performed without difficulty. The patient                            tolerated the procedure well. The quality of the                            bowel preparation was good. The terminal ileum,                            ileocecal valve, appendiceal orifice, and rectum                            were photographed. Scope In: 8:53:25 AM Scope Out: 9:23:17 AM Scope Withdrawal Time: 0 hours 27 minutes 49 seconds  Total Procedure Duration: 0 hours 29 minutes 52 seconds  Findings:                 The terminal ileum appeared normal.                           Eight sessile polyps were found in the transverse                            colon, ascending colon and cecum. The polyps were 3                            to 6 mm in size. These polyps were removed with a                            cold snare. Resection and retrieval were complete.                           A 2 mm polyp was found in the ascending colon. The                            polyp was sessile.  The polyp was removed with a                            cold biopsy forceps. Resection and retrieval were                            complete.                           Multiple small and large-mouthed diverticula were                            found in the sigmoid colon and descending colon.                           Two sessile polyps were found in the rectum and                            sigmoid colon. The polyps were 3 to 4 mm in size.                            These polyps were removed with a cold snare. Polyp                            resection was incomplete, and the resected tissue                            was partially retrieved.                           A tattoo was seen in the rectum. A scar was found                            at the tattoo site.                           Non-bleeding internal hemorrhoids were found during                             retroflexion. Complications:            Patient had a few seconds of asystole, which                            required no intervention Estimated Blood Loss:     Estimated blood loss was minimal. Impression:               - The examined portion of the ileum was normal.                           - Eight 3 to 6 mm polyps in the transverse colon,                            in the ascending colon and in the cecum, removed  with a cold snare. Resected and retrieved.                           - One 2 mm polyp in the ascending colon, removed                            with a cold biopsy forceps. Resected and retrieved.                           - Diverticulosis in the sigmoid colon and in the                            descending colon.                           - Two 3 to 4 mm polyps in the rectum and in the                            sigmoid colon, removed with a cold snare. Polyp                            resection was incomplete, and the resected tissue                            was partially retrieved.                           - A tattoo was seen in the rectum. A scar was found                            at the tattoo site.                           - Non-bleeding internal hemorrhoids. Recommendation:           - Discharge patient to home (with escort).                           - Await pathology results.                           - Referral to cardiology regarding the brief                            episode of asystole that occured during the                            procedure. A 12-lead ECG was performed following                            the procedure. Patient did not describe any chest                            pain or SOB following the procedure.                           -  The findings and recommendations were discussed                            with the patient. Dr Georgian Co "Lyndee Leo" Lorenso Courier,  01/02/2022 9:37:11 AM

## 2022-01-02 NOTE — Progress Notes (Signed)
GASTROENTEROLOGY PROCEDURE H&P NOTE   Primary Care Physician: Pcp, No    Reason for Procedure:   History of colon polyps  Plan:    Colonoscopy  Patient is appropriate for endoscopic procedure(s) in the ambulatory ([place]) setting.  The nature of the procedure, as well as the risks, benefits, and alternatives were carefully and thoroughly reviewed with the patient. Ample time for discussion and questions allowed. The patient understood, was satisfied, and agreed to proceed.     HPI: Johnathan Wall is a 76 y.o. male who presents for colonoscopy for evaluation of history of colon polyps .  Patient was most recently seen in the Gastroenterology Clinic on 12/20/21.  No interval change in medical history since that appointment. Please refer to that note for full details regarding GI history and clinical presentation.   Past Medical History:  Diagnosis Date   Diabetes mellitus without complication ([place])    Hypertension    Mixed hyperlipidemia    Obstructive sleep apnea (adult) (pediatric)    Sleep apnea    cpap   Type 2 diabetes mellitus with diabetic chronic kidney disease ([place])    Type 2 diabetes mellitus with diabetic polyneuropathy ([place])     Past Surgical History:  Procedure Laterality Date   COLONOSCOPY     FOOT SURGERY     KNEE ARTHROPLASTY     right   POLYPECTOMY     SHOULDER ARTHROSCOPY W/ ROTATOR CUFF REPAIR     bilateral    Prior to Admission medications   Medication Sig Start Date End Date Taking? Authorizing Provider  amLODipine (NORVASC) 10 MG tablet Take 10 mg by mouth daily. 09/05/14  Yes [provider]  Apoaequorin (PREVAGEN PO) Take 1 tablet by mouth daily.   Yes [provider]  Ascorbic Acid (VITAMIN C GUMMIE PO) Take 1 tablet by mouth daily at 2 PM.   Yes [provider]  aspirin 81 MG tablet Take 81 mg by mouth daily.   Yes [provider]  atorvastatin (LIPITOR) 40 MG tablet Take 40 mg by mouth daily. 09/05/14  Yes  [provider]  metFORMIN (GLUCOPHAGE) 1000 MG tablet Take 1,000 mg by mouth 2 (two) times daily with a meal. 09/05/14  Yes [provider]  NOVOLIN 70/30 RELION (70-30) 100 UNIT/ML injection  10/30/14  Yes [provider]  quinapril (ACCUPRIL) 40 MG tablet Take 40 mg by mouth daily. 09/05/14  Yes [provider]  TRULICITY 1.5 XB/1.4NW SOPN once a week. 04/30/19   [provider]  vitamin B-12 (CYANOCOBALAMIN) 100 MCG tablet Take 100 mcg by mouth daily.    [provider]    Current Outpatient Medications  Medication Sig Dispense Refill   amLODipine (NORVASC) 10 MG tablet Take 10 mg by mouth daily.     Apoaequorin (PREVAGEN PO) Take 1 tablet by mouth daily.     Ascorbic Acid (VITAMIN C GUMMIE PO) Take 1 tablet by mouth daily at 2 PM.     aspirin 81 MG tablet Take 81 mg by mouth daily.     atorvastatin (LIPITOR) 40 MG tablet Take 40 mg by mouth daily.     metFORMIN (GLUCOPHAGE) 1000 MG tablet Take 1,000 mg by mouth 2 (two) times daily with a meal.     NOVOLIN 70/30 RELION (70-30) 100 UNIT/ML injection      quinapril (ACCUPRIL) 40 MG tablet Take 40 mg by mouth daily.     TRULICITY 1.5 GN/5.6OZ SOPN once a week.  vitamin B-12 (CYANOCOBALAMIN) 100 MCG tablet Take 100 mcg by mouth daily.     Current Facility-Administered Medications  Medication Dose Route Frequency Provider Last Rate Last Admin   0.9 %  sodium chloride infusion  500 mL Intravenous Once Sharyn Creamer, MD        Allergies as of 01/02/2022 - Review Complete 01/02/2022  Allergen Reaction Noted   Contrast media [iodinated contrast media]  07/30/2019   Empagliflozin  07/30/2019   Iodine Hives 11/02/2014   Liraglutide  07/30/2019   Morphine  12/24/2008    Family History  Problem Relation Age of Onset   Diabetes type II Mother    Heart attack Brother    Colon cancer Neg Hx    Colon polyps Neg Hx    Esophageal cancer Neg Hx    Rectal cancer Neg Hx    Stomach cancer  Neg Hx    Pancreatic cancer Neg Hx     Social History   Socioeconomic History   Marital status: Married    Spouse name: Not on file   Number of children: 1   Years of education: Not on file   Highest education level: Not on file  Occupational History   Occupation: Retired  Tobacco Use   Smoking status: Never   Smokeless tobacco: Never  Vaping Use   Vaping Use: Never used  Substance and Sexual Activity   Alcohol use: Yes    Alcohol/week: 0.0 standard drinks of alcohol    Comment: occasional beer   Drug use: No   Sexual activity: Not on file  Other Topics Concern   Not on file  Social History Narrative   Not on file   Social Determinants of Health   Financial Resource Strain: Not on file  Food Insecurity: Not on file  Transportation Needs: Not on file  Physical Activity: Not on file  Stress: Not on file  Social Connections: Not on file  Intimate Partner Violence: Not on file    Physical Exam: Vital signs in last 24 hours: BP (!) 160/66   Pulse 81   Temp 97.8 F (36.6 C)   Ht '5\' 3"'$  (1.6 m)   Wt 230 lb (104.3 kg)   SpO2 96%   BMI 40.74 kg/m  GEN: NAD EYE: Sclerae anicteric ENT: MMM CV: Non-tachycardic Pulm: No increased WOB GI: Soft NEURO:  Alert & Oriented   Christia Reading, MD [facility]   01/02/2022 8:46 AM

## 2022-01-02 NOTE — Telephone Encounter (Signed)
Urgent referral placed for cardiology in [place].

## 2022-01-02 NOTE — Progress Notes (Signed)
0916 HR < 40 with Robinul 0.2 mg given IV. MD updated, vss

## 2022-01-02 NOTE — Progress Notes (Signed)
Report given to PACU, vss 

## 2022-01-02 NOTE — Telephone Encounter (Signed)
-----   Message from Sharyn Creamer, MD sent at 01/02/2022  9:39 AM EDT ----- Jamas Lav, could we place a urgent referral to cardiology for a brief episode of asystole that occurred during his GI procedure today? Thanks.

## 2022-01-02 NOTE — Patient Instructions (Signed)
REFERRAL TO CARDIOLOGY REGARDING THE BRIEF EPISODE OF ASYSTOLE THAT OCCURRED DURING THE PROCEDURE. A 12 LEAD ECG WAS PERFORMED FOLLOWING THE PROCEDURE.    HANDOUTS ON POLYPS, DIVERTICULOSIS AND HEMORRHOIDS GIVEN.  YOU HAD AN ENDOSCOPIC PROCEDURE TODAY AT [facility]:   Refer to the procedure report that was given to you for any specific questions about what was found during the examination.  If the procedure report does not answer your questions, please call your gastroenterologist to clarify.  If you requested that your care partner not be given the details of your procedure findings, then the procedure report has been included in a sealed envelope for you to review at your convenience later.  YOU SHOULD EXPECT: Some feelings of bloating in the abdomen. Passage of more gas than usual.  Walking can help get rid of the air that was put into your GI tract during the procedure and reduce the bloating. If you had a lower endoscopy (such as a colonoscopy or flexible sigmoidoscopy) you may notice spotting of blood in your stool or on the toilet paper. If you underwent a bowel prep for your procedure, you may not have a normal bowel movement for a few days.  Please Note:  You might notice some irritation and congestion in your nose or some drainage.  This is from the oxygen used during your procedure.  There is no need for concern and it should clear up in a day or so.  SYMPTOMS TO REPORT IMMEDIATELY:  Following lower endoscopy (colonoscopy or flexible sigmoidoscopy):  Excessive amounts of blood in the stool  Significant tenderness or worsening of abdominal pains  Swelling of the abdomen that is new, acute  Fever of 100F or higher  For urgent or emergent issues, a gastroenterologist can be reached at any hour by calling [phone]. Do not use MyChart messaging for urgent concerns.    DIET:  We do recommend a small meal at first, but then you may proceed to your regular diet.   Drink plenty of fluids but you should avoid alcoholic beverages for 24 hours.  ACTIVITY:  You should plan to take it easy for the rest of today and you should NOT DRIVE or use heavy machinery until tomorrow (because of the sedation medicines used during the test).    FOLLOW UP: Our staff will call the number listed on your records the next business day following your procedure.  We will call around 7:15- 8:00 am to check on you and address any questions or concerns that you may have regarding the information given to you following your procedure. If we do not reach you, we will leave a message.     If any biopsies were taken you will be contacted by phone or by letter within the next 1-3 weeks.  Please call us at [phone] if you have not heard about the biopsies in 3 weeks.    SIGNATURES/CONFIDENTIALITY: You and/or your care partner have signed paperwork which will be entered into your electronic medical record.  These signatures attest to the fact that that the information above on your After Visit Summary has been reviewed and is understood.  Full responsibility of the confidentiality of this discharge information lies with you and/or your care-partner.

## 2022-01-02 NOTE — Progress Notes (Signed)
Pt's states no medical or surgical changes since previsit or office visit. 

## 2022-01-03 ENCOUNTER — Telehealth: Payer: Self-pay

## 2022-01-03 NOTE — Telephone Encounter (Signed)
Post procedure follow up call, no answer 

## 2022-01-04 ENCOUNTER — Encounter: Payer: Self-pay | Admitting: Internal Medicine

## 2022-01-09 DIAGNOSIS — E669 Obesity, unspecified: Secondary | ICD-10-CM | POA: Diagnosis not present

## 2022-01-09 DIAGNOSIS — N182 Chronic kidney disease, stage 2 (mild): Secondary | ICD-10-CM | POA: Diagnosis not present

## 2022-01-09 DIAGNOSIS — Z23 Encounter for immunization: Secondary | ICD-10-CM | POA: Diagnosis not present

## 2022-01-09 DIAGNOSIS — Z794 Long term (current) use of insulin: Secondary | ICD-10-CM | POA: Diagnosis not present

## 2022-01-09 DIAGNOSIS — E1122 Type 2 diabetes mellitus with diabetic chronic kidney disease: Secondary | ICD-10-CM | POA: Diagnosis not present

## 2022-01-09 DIAGNOSIS — I10 Essential (primary) hypertension: Secondary | ICD-10-CM | POA: Diagnosis not present

## 2022-01-09 DIAGNOSIS — E1142 Type 2 diabetes mellitus with diabetic polyneuropathy: Secondary | ICD-10-CM | POA: Diagnosis not present

## 2022-01-25 ENCOUNTER — Other Ambulatory Visit: Payer: Self-pay

## 2022-01-25 DIAGNOSIS — E1142 Type 2 diabetes mellitus with diabetic polyneuropathy: Secondary | ICD-10-CM | POA: Insufficient documentation

## 2022-01-25 DIAGNOSIS — E782 Mixed hyperlipidemia: Secondary | ICD-10-CM | POA: Insufficient documentation

## 2022-01-25 DIAGNOSIS — E119 Type 2 diabetes mellitus without complications: Secondary | ICD-10-CM | POA: Insufficient documentation

## 2022-01-25 DIAGNOSIS — E1122 Type 2 diabetes mellitus with diabetic chronic kidney disease: Secondary | ICD-10-CM | POA: Insufficient documentation

## 2022-01-25 NOTE — Progress Notes (Unsigned)
Cardiology Office Note:    Date:  01/26/2022   ID:  Johnathan Wall, DOB 08-11-1945, MRN [id number]  PCP:  Johnathan Wall, No  Cardiologist:  Johnathan More, MD   Referring MD: Johnathan Creamer, MD  ASSESSMENT:    1. Bradycardia   2. Nonspecific abnormal electrocardiogram (ECG) (EKG)   3. Primary hypertension   4. Mixed hyperlipidemia    PLAN:    In order of problems listed above:  He had what is described as brief asystole but it did not require emergency response interruption of the procedure at least initially there was no discussion of this with the wife after the procedure and was discharged home.  I reviewed with Johnathan Wall and his wife it is important to plate attention to clinical signals he has an abnormal EKG with conduction delay first-degree AV block I think he benefit from a 1 week event monitor to define if he has high-grade conduction system disease.  I am also concerned that his EKG is abnormal suggesting previous septal infarction and an echocardiogram be ordered if abnormal he should undergo an ischemia evaluation.  With his resting heart rate in the 60s and 70s we should avoid rate slowing medications I told him in the future for colonoscopy to be sure that he tells people about this event.  It is conceivable that what happened to him was a response to anesthesia like fentanyl or propofol both of which can cause bradycardia and/or brief pauses.  It could also be due to insufflation of the colon. Stay well hypertension Home blood pressure is at target and controlled with his nonrate limiting calcium channel blocker and ACE inhibitor He is taking his statin with diabetes Stable diabetes  Next appointment 6 to 8 weeks   Medication Adjustments/Labs and Tests Ordered: Current medicines are reviewed at length with the patient today.  Concerns regarding medicines are outlined above.  No orders of the defined types were placed in this encounter.  No orders of the defined types were placed in  this encounter.    I had a slow heart rate described as brief asystole during colonoscopy, this is my chief complaint I was told to see a cardiologist  History of Present Illness:    Johnathan Wall is a 76 y.o. 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  Colonoscopy note relates brief asystole did not require interruption of the procedure or any urgent intervention.    He has a history of colon polyps and has had multiple colonoscopies he has never had a problem before I cannot see a note but he tells me he had anesthesia I assume it was propofol and he had what is described as brief asystole but there is no emergency response it was not discussed with his wife in the recovery area and the procedure is not interrupted. He is advised to see a cardiologist At the child he had a problem with rapid heart rhythms in about eighth grade it resolved He has obstructive sleep apnea and nasal congestion from CPAP but does not have edema shortness of breath chest pain palpitation or syncope For 1 week he has trended heart rates and blood pressures heart rate 60s and 70s blood pressure runs greater than 110-130/70-80 He takes no rate slowing medications He tells me he has not had an EKG performed for many years He has  had no history of congenital rheumatic heart disease or adult arrhythmia. Past Medical History:  Diagnosis Date   Bilateral sensorineural hearing loss 07/07/2019   Chronic kidney disease, stage 2 (mild) 09/18/2019   Deviated septum 09/18/2017   Diabetes mellitus without complication ([place])    Diabetic renal disease ([place]) 09/18/2019   Hyperlipidemia 09/18/2019   Hypertension    Low back pain 11/10/2021   Mixed hyperlipidemia    Neck mass 09/18/2017   Obesity 09/18/2019   Obstructive sleep apnea 09/18/2017   Pain of left hip joint  11/10/2021   Pain of right thumb 03/13/2018   Polyneuropathy due to type 2 diabetes mellitus ([place]) 09/18/2019   Radial styloid tenosynovitis of right hand 03/13/2018   Tinnitus, bilateral 09/18/2017   Type 2 diabetes mellitus with diabetic chronic kidney disease ([place])    Type 2 diabetes mellitus with diabetic polyneuropathy ([place])     Past Surgical History:  Procedure Laterality Date   COLONOSCOPY     FOOT SURGERY     KNEE ARTHROPLASTY     right   POLYPECTOMY     SHOULDER ARTHROSCOPY W/ ROTATOR CUFF REPAIR     bilateral    Current Medications: Current Meds  Medication Sig   amLODipine (NORVASC) 10 MG tablet Take 10 mg by mouth daily.   Ascorbic Acid (VITAMIN C GUMMIE PO) Take 1 tablet by mouth daily at 2 PM.   aspirin 81 MG tablet Take 81 mg by mouth daily.   atorvastatin (LIPITOR) 40 MG tablet Take 40 mg by mouth daily.   metFORMIN (GLUCOPHAGE) 1000 MG tablet Take 1,000 mg by mouth 2 (two) times daily with a meal.   NOVOLIN 70/30 RELION (70-30) 100 UNIT/ML injection Inject 50 Units into the skin 2 (two) times daily.   quinapril (ACCUPRIL) 40 MG tablet Take 40 mg by mouth daily.   TRULICITY 1.5 RW/4.3XV SOPN Inject 1.5 mg into the skin once a week.     Allergies:   Contrast media [iodinated contrast media], Empagliflozin, Iodine, Liraglutide, and Morphine   Social History   Socioeconomic History   Marital status: Married    Spouse name: Not on file   Number of children: 1   Years of education: Not on file   Highest education level: Not on file  Occupational History   Occupation: Retired  Tobacco Use   Smoking status: Never   Smokeless tobacco: Never  Vaping Use   Vaping Use: Never used  Substance and Sexual Activity   Alcohol use: Yes    Alcohol/week: 0.0 standard drinks of alcohol    Comment: occasional beer   Drug use: No   Sexual activity: Not on file  Other Topics Concern   Not on file  Social History Narrative   Not on file   Social Determinants of Health    Financial Resource Strain: Not on file  Food Insecurity: Not on file  Transportation Needs: Not on file  Physical Activity: Not on file  Stress: Not on file  Social Connections: Not on file     Family History: The patient's family history includes Diabetes type II in his mother; Heart attack in his brother. There is no history of Colon cancer, Colon polyps, Esophageal cancer, Rectal cancer, Stomach cancer, or Pancreatic cancer.  ROS:   ROS Please see the history of present illness.     All other systems reviewed and are negative.  EKGs/Labs/Other Studies Reviewed:    The following studies were reviewed today:   EKG:  EKG  is  ordered today.  The ekg ordered today is personally reviewed and demonstrates sinus rhythm left axis deviation abnormal pattern suggesting previous anteroseptal infarction and incomplete right bundle branch block  Recent Labs: 01/09/2022: Cholesterol 73 LDL 8 A1c 7.5 creatinine 1.09 potassium 4.4  Physical Exam:    VS:  BP (!) 152/72   Pulse 77   Ht 5' 5.6" (1.666 m)   Wt 224 lb 12.8 oz (102 kg)   SpO2 95%   BMI 36.73 kg/m     Wt Readings from Last 3 Encounters:  01/26/22 224 lb 12.8 oz (102 kg)  01/02/22 230 lb (104.3 kg)  12/20/21 230 lb 4 oz (104.4 kg)     GEN: Overweight marked central obesity BMI exceeds 35 well nourished, well developed in no acute distress HEENT: Normal NECK: No JVD; No carotid bruits LYMPHATICS: No lymphadenopathy CARDIAC: RRR, no murmurs, rubs, gallops RESPIRATORY:  Clear to auscultation without rales, wheezing or rhonchi  ABDOMEN: Soft, non-tender, non-distended MUSCULOSKELETAL:  No edema; No deformity  SKIN: Warm and dry NEUROLOGIC:  Alert and oriented x 3 PSYCHIATRIC:  Normal affect     Signed, Johnathan More, MD  01/26/2022 3:01 PM    [facility]

## 2022-01-26 ENCOUNTER — Ambulatory Visit: Payer: Medicare HMO | Attending: Cardiology | Admitting: Cardiology

## 2022-01-26 ENCOUNTER — Ambulatory Visit: Payer: Medicare HMO | Attending: Cardiology

## 2022-01-26 ENCOUNTER — Encounter: Payer: Self-pay | Admitting: Cardiology

## 2022-01-26 VITALS — BP 152/72 | HR 77 | Ht 65.6 in | Wt 224.8 lb

## 2022-01-26 DIAGNOSIS — R001 Bradycardia, unspecified: Secondary | ICD-10-CM

## 2022-01-26 DIAGNOSIS — R9431 Abnormal electrocardiogram [ECG] [EKG]: Secondary | ICD-10-CM | POA: Diagnosis not present

## 2022-01-26 DIAGNOSIS — I10 Essential (primary) hypertension: Secondary | ICD-10-CM | POA: Diagnosis not present

## 2022-01-26 DIAGNOSIS — E782 Mixed hyperlipidemia: Secondary | ICD-10-CM

## 2022-01-26 NOTE — Patient Instructions (Signed)
Medication Instructions:  Your physician recommends that you continue on your current medications as directed. Please refer to the Current Medication list given to you today.  *If you need a refill on your cardiac medications before your next appointment, please call your pharmacy*   Lab Work: NONE If you have labs (blood work) drawn today and your tests are completely normal, you will receive your results only by: [place] (if you have MyChart) OR A paper copy in the mail If you have any lab test that is abnormal or we need to change your treatment, we will call you to review the results.   Testing/Procedures: Your physician has requested that you have an echocardiogram. Echocardiography is a painless test that uses sound waves to create images of your heart. It provides your doctor with information about the size and shape of your heart and how well your heart's chambers and valves are working. This procedure takes approximately one hour. There are no restrictions for this procedure. Please do NOT wear cologne, perfume, aftershave, or lotions (deodorant is allowed). Please arrive 15 minutes prior to your appointment time.   You have been asked to wear a Zio Heart Monitor today. It is to be worn for 7 days. Please remove the monitor on 10/26 and mail back in the box provided.  If you have any questions about the monitor please call the company at [phone]     Follow-Up: At [facility], you and your health needs are our priority.  As part of our continuing mission to provide you with exceptional heart care, we have created designated Provider Care Teams.  These Care Teams include your primary Cardiologist (physician) and Advanced Practice Providers (APPs -  Physician Assistants and Nurse Practitioners) who all work together to provide you with the care you need, when you need it.  We recommend signing up for the patient portal called "MyChart".  Sign up information is  provided on this After Visit Summary.  MyChart is used to connect with patients for Virtual Visits (Telemedicine).  Patients are able to view lab/test results, encounter notes, upcoming appointments, etc.  Non-urgent messages can be sent to your provider as well.   To learn more about what you can do with MyChart, go to [email or web address].    Your next appointment:   6 week(s)  The format for your next appointment:   In Person  Provider:   Shirlee More, MD    Other Instructions   Important Information About Sugar

## 2022-02-07 DIAGNOSIS — R001 Bradycardia, unspecified: Secondary | ICD-10-CM | POA: Diagnosis not present

## 2022-02-07 DIAGNOSIS — R9431 Abnormal electrocardiogram [ECG] [EKG]: Secondary | ICD-10-CM | POA: Diagnosis not present

## 2022-02-14 ENCOUNTER — Ambulatory Visit: Payer: Medicare HMO | Attending: Cardiology

## 2022-02-14 ENCOUNTER — Telehealth: Payer: Self-pay | Admitting: *Deleted

## 2022-02-14 DIAGNOSIS — E782 Mixed hyperlipidemia: Secondary | ICD-10-CM

## 2022-02-14 DIAGNOSIS — R001 Bradycardia, unspecified: Secondary | ICD-10-CM

## 2022-02-14 DIAGNOSIS — R9431 Abnormal electrocardiogram [ECG] [EKG]: Secondary | ICD-10-CM | POA: Diagnosis not present

## 2022-02-14 DIAGNOSIS — I10 Essential (primary) hypertension: Secondary | ICD-10-CM

## 2022-02-14 LAB — ECHOCARDIOGRAM COMPLETE
AR max vel: 2.8 cm2
AV Area VTI: 2.86 cm2
AV Area mean vel: 2.79 cm2
AV Mean grad: 11 mmHg
AV Peak grad: 17.8 mmHg
Ao pk vel: 2.11 m/s
Area-P 1/2: 2.99 cm2
S' Lateral: 3.7 cm

## 2022-02-14 NOTE — Telephone Encounter (Signed)
Left message on pt's home machine to call back about monitor results.

## 2022-02-14 NOTE — Telephone Encounter (Signed)
Pt was in our office today getting an echo and was informed of his normal monitor results. No further questions.

## 2022-02-14 NOTE — Telephone Encounter (Signed)
-----   Message from Richardo Priest, MD sent at 02/10/2022 12:27 PM EDT ----- Normal or stable result

## 2022-02-16 ENCOUNTER — Other Ambulatory Visit: Payer: Self-pay

## 2022-02-16 DIAGNOSIS — R9431 Abnormal electrocardiogram [ECG] [EKG]: Secondary | ICD-10-CM

## 2022-02-21 ENCOUNTER — Telehealth: Payer: Self-pay | Admitting: Cardiology

## 2022-02-21 NOTE — Telephone Encounter (Signed)
Patient is calling for his monitor results.

## 2022-02-21 NOTE — Telephone Encounter (Signed)
Results reviewed with pt as per Dr. Munley's note.  Pt verbalized understanding and had no additional questions. Routed to PCP  

## 2022-03-15 ENCOUNTER — Ambulatory Visit: Payer: Medicare HMO | Attending: Cardiology | Admitting: Cardiology

## 2022-03-15 ENCOUNTER — Encounter: Payer: Self-pay | Admitting: Cardiology

## 2022-03-15 VITALS — BP 144/58 | HR 78 | Ht 65.6 in | Wt 229.0 lb

## 2022-03-15 DIAGNOSIS — R001 Bradycardia, unspecified: Secondary | ICD-10-CM

## 2022-03-15 DIAGNOSIS — I10 Essential (primary) hypertension: Secondary | ICD-10-CM

## 2022-03-15 DIAGNOSIS — E782 Mixed hyperlipidemia: Secondary | ICD-10-CM

## 2022-03-15 NOTE — Progress Notes (Signed)
Cardiology Office Note:    Date:  03/15/2022   ID:  Johnathan Wall, DOB December 09, 1945, MRN [id number]  PCP:  Johnathan Wall, No  Cardiologist:  Johnathan More, MD    Referring MD: Johnathan Priest, MD    ASSESSMENT:    1. Bradycardia   2. Primary hypertension   3. Mixed hyperlipidemia    PLAN:    In order of problems listed above:  His bradycardia was in the context of colonoscopy and general anesthesia and likely propofol he does not have any ongoing conduction system disease and requires no further cardiac testing.  Told the patient and his wife to be sure to tell people in the future they bradycardia during the procedure and with propofol Stable angina continue current treatment ACE inhibitor calcium channel blocker Continue his statin for lipid disorder   Next appointment: I will see back in the office as needed   Medication Adjustments/Labs and Tests Ordered: Current medicines are reviewed at length with the patient today.  Concerns regarding medicines are outlined above.  No orders of the defined types were placed in this encounter.  No orders of the defined types were placed in this encounter.   Chief Complaint  Patient presents with   Follow-up   Bradycardia    History of Present Illness:    Johnathan Wall is a 76 y.o. 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 Colonoscopy note relates brief asystole did not require interruption of the procedure or any urgent intervention.   Compliance with diet, lifestyle and medications: Yes  I reviewed the testing results with Johnathan Wall and his wife He is feeling well and he is not having any palpitation lightheadedness syncope edema shortness of breath or chest pain He takes separates no rate slowing medications  For further evaluation he underwent an  event monitor report 02/10/2022 showing Mobitz 1 second-degree AV block there were no pauses of 3 seconds or greater and no episodes of higher degree heart block ventricular and supraventricular arrhythmia was rare.  Echocardiogram showed normal left ventricular size moderate LVH EF 60 to 65% right ventricle normal size function and normal pressure.  There is mild mitral regurgitation.  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 Past Medical History:  Diagnosis Date   Bilateral sensorineural hearing loss 07/07/2019   Chronic kidney disease, stage 2 (mild) 09/18/2019   Deviated septum 09/18/2017   Diabetes mellitus without complication ([place])    Diabetic renal disease ([place]) 09/18/2019   Hyperlipidemia 09/18/2019   Hypertension    Low back pain 11/10/2021   Mixed hyperlipidemia    Neck mass 09/18/2017   Obesity 09/18/2019   Obstructive sleep apnea 09/18/2017   Pain of left hip joint 11/10/2021   Pain of right thumb 03/13/2018   Polyneuropathy due to type 2 diabetes mellitus ([place]) 09/18/2019   Radial styloid tenosynovitis of right hand 03/13/2018   Tinnitus, bilateral 09/18/2017   Type 2 diabetes mellitus with diabetic chronic kidney disease ([place])    Type 2 diabetes mellitus with diabetic polyneuropathy ([place])     Past Surgical History:  Procedure Laterality Date   COLONOSCOPY     FOOT SURGERY     KNEE ARTHROPLASTY  right   POLYPECTOMY     SHOULDER ARTHROSCOPY W/ ROTATOR CUFF REPAIR     bilateral    Current Medications: Current Meds  Medication Sig   amLODipine (NORVASC) 10 MG tablet Take 10 mg by mouth daily.   Ascorbic Acid (VITAMIN C GUMMIE PO) Take 1 tablet by mouth daily at 2 PM.   aspirin 81 MG tablet Take 81 mg by mouth daily.   atorvastatin (LIPITOR) 40 MG tablet Take 40 mg by mouth daily.   metFORMIN (GLUCOPHAGE) 1000 MG tablet Take 1,000  mg by mouth 2 (two) times daily with a meal.   NOVOLIN 70/30 RELION (70-30) 100 UNIT/ML injection Inject 50 Units into the skin 2 (two) times daily.   quinapril (ACCUPRIL) 40 MG tablet Take 40 mg by mouth daily.   TRULICITY 1.5 ME/1.5AX SOPN Inject 1.5 mg into the skin once a week.     Allergies:   Contrast media [iodinated contrast media], Empagliflozin, Iodine, Liraglutide, and Morphine   Social History   Socioeconomic History   Marital status: Married    Spouse name: Not on file   Number of children: 1   Years of education: Not on file   Highest education level: Not on file  Occupational History   Occupation: Retired  Tobacco Use   Smoking status: Never   Smokeless tobacco: Never  Vaping Use   Vaping Use: Never used  Substance and Sexual Activity   Alcohol use: Yes    Alcohol/week: 0.0 standard drinks of alcohol    Comment: occasional beer   Drug use: No   Sexual activity: Not on file  Other Topics Concern   Not on file  Social History Narrative   Not on file   Social Determinants of Health   Financial Resource Strain: Not on file  Food Insecurity: Not on file  Transportation Needs: Not on file  Physical Activity: Not on file  Stress: Not on file  Social Connections: Not on file     Family History: The patient's family history includes Diabetes type II in his mother; Heart attack in his brother. There is no history of Colon cancer, Colon polyps, Esophageal cancer, Rectal cancer, Stomach cancer, or Pancreatic cancer. ROS:   Please see the history of present illness.    All other systems reviewed and are negative.  EKGs/Labs/Other Studies Reviewed:    The following studies were reviewed today:   Physical Exam:    VS:  BP (!) 144/58 (BP Location: Right Arm, Patient Position: Sitting, Cuff Size: Large)   Pulse 78   Ht 5' 5.6" (1.666 m)   Wt 229 lb (103.9 kg)   SpO2 97%   BMI 37.41 kg/m     Wt Readings from Last 3 Encounters:  03/15/22 229 lb (103.9 kg)   01/26/22 224 lb 12.8 oz (102 kg)  01/02/22 230 lb (104.3 kg)     GEN:  Well nourished, well developed in no acute distress HEENT: Normal NECK: No JVD; No carotid bruits LYMPHATICS: No lymphadenopathy CARDIAC: RRR, no murmurs, rubs, gallops RESPIRATORY:  Clear to auscultation without rales, wheezing or rhonchi  ABDOMEN: Soft, non-tender, non-distended MUSCULOSKELETAL:  No edema; No deformity  SKIN: Warm and dry NEUROLOGIC:  Alert and oriented x 3 PSYCHIATRIC:  Normal affect    Signed, Johnathan More, MD  03/15/2022 3:35 PM    [facility]

## 2022-03-15 NOTE — Patient Instructions (Signed)
Medication Instructions:  Your physician recommends that you continue on your current medications as directed. Please refer to the Current Medication list given to you today.  *If you need a refill on your cardiac medications before your next appointment, please call your pharmacy*   Lab Work: None If you have labs (blood work) drawn today and your tests are completely normal, you will receive your results only by: [place] (if you have MyChart) OR A paper copy in the mail If you have any lab test that is abnormal or we need to change your treatment, we will call you to review the results.   Testing/Procedures: None   Follow-Up: At [facility], you and your health needs are our priority.  As part of our continuing mission to provide you with exceptional heart care, we have created designated Provider Care Teams.  These Care Teams include your primary Cardiologist (physician) and Advanced Practice Providers (APPs -  Physician Assistants and Nurse Practitioners) who all work together to provide you with the care you need, when you need it.  We recommend signing up for the patient portal called "MyChart".  Sign up information is provided on this After Visit Summary.  MyChart is used to connect with patients for Virtual Visits (Telemedicine).  Patients are able to view lab/test results, encounter notes, upcoming appointments, etc.  Non-urgent messages can be sent to your provider as well.   To learn more about what you can do with MyChart, go to [email or web address].    Your next appointment:   Follow up as needed  The format for your next appointment:   In Person  Provider:   Shirlee More, MD    Other Instructions None  Important Information About Sugar

## 2022-05-16 DIAGNOSIS — J069 Acute upper respiratory infection, unspecified: Secondary | ICD-10-CM | POA: Diagnosis not present

## 2022-05-16 DIAGNOSIS — E1169 Type 2 diabetes mellitus with other specified complication: Secondary | ICD-10-CM | POA: Diagnosis not present

## 2022-05-16 DIAGNOSIS — E669 Obesity, unspecified: Secondary | ICD-10-CM | POA: Diagnosis not present

## 2022-05-16 DIAGNOSIS — R5383 Other fatigue: Secondary | ICD-10-CM | POA: Diagnosis not present

## 2022-05-16 DIAGNOSIS — E7800 Pure hypercholesterolemia, unspecified: Secondary | ICD-10-CM | POA: Diagnosis not present

## 2022-05-16 DIAGNOSIS — Z6839 Body mass index (BMI) 39.0-39.9, adult: Secondary | ICD-10-CM | POA: Diagnosis not present

## 2022-05-16 DIAGNOSIS — I10 Essential (primary) hypertension: Secondary | ICD-10-CM | POA: Diagnosis not present

## 2022-05-25 DIAGNOSIS — D225 Melanocytic nevi of trunk: Secondary | ICD-10-CM | POA: Diagnosis not present

## 2022-05-25 DIAGNOSIS — L814 Other melanin hyperpigmentation: Secondary | ICD-10-CM | POA: Diagnosis not present

## 2022-05-25 DIAGNOSIS — L570 Actinic keratosis: Secondary | ICD-10-CM | POA: Diagnosis not present

## 2022-05-25 DIAGNOSIS — L821 Other seborrheic keratosis: Secondary | ICD-10-CM | POA: Diagnosis not present

## 2022-05-30 DIAGNOSIS — E669 Obesity, unspecified: Secondary | ICD-10-CM | POA: Diagnosis not present

## 2022-05-30 DIAGNOSIS — R809 Proteinuria, unspecified: Secondary | ICD-10-CM | POA: Diagnosis not present

## 2022-05-30 DIAGNOSIS — E7800 Pure hypercholesterolemia, unspecified: Secondary | ICD-10-CM | POA: Diagnosis not present

## 2022-05-30 DIAGNOSIS — I10 Essential (primary) hypertension: Secondary | ICD-10-CM | POA: Diagnosis not present

## 2022-05-30 DIAGNOSIS — E1169 Type 2 diabetes mellitus with other specified complication: Secondary | ICD-10-CM | POA: Diagnosis not present

## 2022-05-30 DIAGNOSIS — Z6838 Body mass index (BMI) 38.0-38.9, adult: Secondary | ICD-10-CM | POA: Diagnosis not present

## 2022-06-13 DIAGNOSIS — I10 Essential (primary) hypertension: Secondary | ICD-10-CM | POA: Diagnosis not present

## 2022-06-13 DIAGNOSIS — Z6838 Body mass index (BMI) 38.0-38.9, adult: Secondary | ICD-10-CM | POA: Diagnosis not present

## 2022-06-13 DIAGNOSIS — E669 Obesity, unspecified: Secondary | ICD-10-CM | POA: Diagnosis not present

## 2022-06-13 DIAGNOSIS — R809 Proteinuria, unspecified: Secondary | ICD-10-CM | POA: Diagnosis not present

## 2022-06-13 DIAGNOSIS — E7800 Pure hypercholesterolemia, unspecified: Secondary | ICD-10-CM | POA: Diagnosis not present

## 2022-06-13 DIAGNOSIS — E1169 Type 2 diabetes mellitus with other specified complication: Secondary | ICD-10-CM | POA: Diagnosis not present

## 2022-06-26 DIAGNOSIS — I10 Essential (primary) hypertension: Secondary | ICD-10-CM | POA: Diagnosis not present

## 2022-06-27 DIAGNOSIS — R809 Proteinuria, unspecified: Secondary | ICD-10-CM | POA: Diagnosis not present

## 2022-06-27 DIAGNOSIS — Z6838 Body mass index (BMI) 38.0-38.9, adult: Secondary | ICD-10-CM | POA: Diagnosis not present

## 2022-06-27 DIAGNOSIS — E669 Obesity, unspecified: Secondary | ICD-10-CM | POA: Diagnosis not present

## 2022-06-27 DIAGNOSIS — I10 Essential (primary) hypertension: Secondary | ICD-10-CM | POA: Diagnosis not present

## 2022-06-27 DIAGNOSIS — E1169 Type 2 diabetes mellitus with other specified complication: Secondary | ICD-10-CM | POA: Diagnosis not present

## 2022-06-27 DIAGNOSIS — E7800 Pure hypercholesterolemia, unspecified: Secondary | ICD-10-CM | POA: Diagnosis not present

## 2022-07-10 DIAGNOSIS — I10 Essential (primary) hypertension: Secondary | ICD-10-CM | POA: Diagnosis not present

## 2022-07-11 DIAGNOSIS — E1169 Type 2 diabetes mellitus with other specified complication: Secondary | ICD-10-CM | POA: Diagnosis not present

## 2022-07-11 DIAGNOSIS — E669 Obesity, unspecified: Secondary | ICD-10-CM | POA: Diagnosis not present

## 2022-07-11 DIAGNOSIS — R809 Proteinuria, unspecified: Secondary | ICD-10-CM | POA: Diagnosis not present

## 2022-07-11 DIAGNOSIS — E1122 Type 2 diabetes mellitus with diabetic chronic kidney disease: Secondary | ICD-10-CM | POA: Diagnosis not present

## 2022-07-11 DIAGNOSIS — I10 Essential (primary) hypertension: Secondary | ICD-10-CM | POA: Diagnosis not present

## 2022-07-11 DIAGNOSIS — E1142 Type 2 diabetes mellitus with diabetic polyneuropathy: Secondary | ICD-10-CM | POA: Diagnosis not present

## 2022-07-11 DIAGNOSIS — Z6838 Body mass index (BMI) 38.0-38.9, adult: Secondary | ICD-10-CM | POA: Diagnosis not present

## 2022-07-11 DIAGNOSIS — E7800 Pure hypercholesterolemia, unspecified: Secondary | ICD-10-CM | POA: Diagnosis not present

## 2022-07-11 DIAGNOSIS — Z794 Long term (current) use of insulin: Secondary | ICD-10-CM | POA: Diagnosis not present

## 2022-07-11 DIAGNOSIS — N182 Chronic kidney disease, stage 2 (mild): Secondary | ICD-10-CM | POA: Diagnosis not present

## 2022-07-24 DIAGNOSIS — I10 Essential (primary) hypertension: Secondary | ICD-10-CM | POA: Diagnosis not present

## 2022-07-25 DIAGNOSIS — I10 Essential (primary) hypertension: Secondary | ICD-10-CM | POA: Diagnosis not present

## 2022-07-25 DIAGNOSIS — E7800 Pure hypercholesterolemia, unspecified: Secondary | ICD-10-CM | POA: Diagnosis not present

## 2022-07-25 DIAGNOSIS — E1169 Type 2 diabetes mellitus with other specified complication: Secondary | ICD-10-CM | POA: Diagnosis not present

## 2022-07-25 DIAGNOSIS — E669 Obesity, unspecified: Secondary | ICD-10-CM | POA: Diagnosis not present

## 2022-07-25 DIAGNOSIS — R809 Proteinuria, unspecified: Secondary | ICD-10-CM | POA: Diagnosis not present

## 2022-07-25 DIAGNOSIS — Z6838 Body mass index (BMI) 38.0-38.9, adult: Secondary | ICD-10-CM | POA: Diagnosis not present

## 2023-06-21 ENCOUNTER — Other Ambulatory Visit (HOSPITAL_COMMUNITY): Payer: Self-pay

## 2023-06-26 ENCOUNTER — Other Ambulatory Visit (HOSPITAL_COMMUNITY): Payer: Self-pay

## 2023-06-27 ENCOUNTER — Other Ambulatory Visit (HOSPITAL_COMMUNITY): Payer: Self-pay

## 2023-06-28 ENCOUNTER — Other Ambulatory Visit (HOSPITAL_COMMUNITY): Payer: Self-pay

## 2023-06-28 ENCOUNTER — Other Ambulatory Visit: Payer: Self-pay

## 2023-06-28 MED ORDER — METFORMIN HCL 1000 MG PO TABS
1000.0000 mg | ORAL_TABLET | Freq: Two times a day (BID) | ORAL | 4 refills | Status: AC
  Filled 2023-06-28: qty 180, 90d supply, fill #0
  Filled 2023-12-25: qty 180, 90d supply, fill #1
  Filled 2024-04-28: qty 180, 90d supply, fill #2

## 2023-06-28 MED ORDER — DAPAGLIFLOZIN PROPANEDIOL 10 MG PO TABS
10.0000 mg | ORAL_TABLET | Freq: Every day | ORAL | 4 refills | Status: AC
  Filled 2023-06-28: qty 90, 90d supply, fill #0
  Filled 2023-12-25: qty 90, 90d supply, fill #1
  Filled 2024-04-28: qty 90, 90d supply, fill #2

## 2023-06-28 MED ORDER — DEXCOM G7 SENSOR MISC
4 refills | Status: AC
  Filled 2023-06-28: qty 9, 90d supply, fill #0

## 2023-06-28 MED ORDER — NOVOLIN 70/30 (70-30) 100 UNIT/ML ~~LOC~~ SUSP
50.0000 [IU] | Freq: Two times a day (BID) | SUBCUTANEOUS | 4 refills | Status: AC
  Filled 2023-06-28: qty 90, 90d supply, fill #0
  Filled 2023-12-25: qty 90, 90d supply, fill #1
  Filled 2024-04-28: qty 90, 90d supply, fill #2

## 2023-06-28 MED ORDER — OZEMPIC (0.25 OR 0.5 MG/DOSE) 2 MG/3ML ~~LOC~~ SOPN
0.5000 mg | PEN_INJECTOR | SUBCUTANEOUS | 4 refills | Status: DC
  Filled 2023-06-28: qty 3, 28d supply, fill #0

## 2023-06-28 MED ORDER — AMLODIPINE BESYLATE 10 MG PO TABS
10.0000 mg | ORAL_TABLET | Freq: Every day | ORAL | 4 refills | Status: AC
  Filled 2023-06-28: qty 90, 90d supply, fill #0
  Filled 2023-12-25: qty 90, 90d supply, fill #1
  Filled 2024-04-28: qty 90, 90d supply, fill #2

## 2023-06-28 MED ORDER — ALCOHOL PREP 70 % PADS
MEDICATED_PAD | 4 refills | Status: AC
  Filled 2023-06-28: qty 300, 100d supply, fill #0

## 2023-06-28 MED ORDER — ATORVASTATIN CALCIUM 40 MG PO TABS
40.0000 mg | ORAL_TABLET | Freq: Every day | ORAL | 4 refills | Status: AC
  Filled 2023-06-28: qty 90, 90d supply, fill #0
  Filled 2023-12-25: qty 90, 90d supply, fill #1
  Filled 2024-04-28: qty 90, 90d supply, fill #2

## 2023-06-28 MED ORDER — LISINOPRIL 10 MG PO TABS
10.0000 mg | ORAL_TABLET | Freq: Every day | ORAL | 4 refills | Status: AC
  Filled 2023-06-28: qty 90, 90d supply, fill #0

## 2023-06-28 MED ORDER — ACCU-CHEK GUIDE TEST VI STRP: ORAL_STRIP | 4 refills | Status: AC

## 2023-06-29 ENCOUNTER — Other Ambulatory Visit (HOSPITAL_COMMUNITY): Payer: Self-pay

## 2023-06-29 ENCOUNTER — Other Ambulatory Visit: Payer: Self-pay

## 2023-06-29 MED ORDER — ONETOUCH VERIO VI STRP
ORAL_STRIP | 4 refills | Status: AC
  Filled 2023-06-29: qty 200, 100d supply, fill #0

## 2023-06-29 MED ORDER — ONETOUCH VERIO FLEX SYSTEM W/DEVICE KIT
PACK | 0 refills | Status: AC
Start: 2023-06-28 — End: ?
  Filled 2023-06-29: qty 1, 1d supply, fill #0

## 2023-06-29 MED ORDER — ONETOUCH DELICA PLUS LANCET33G MISC
4 refills | Status: AC
Start: 2023-06-28 — End: ?
  Filled 2023-06-29: qty 200, 100d supply, fill #0

## 2023-07-12 ENCOUNTER — Other Ambulatory Visit (HOSPITAL_COMMUNITY): Payer: Self-pay

## 2023-12-25 ENCOUNTER — Other Ambulatory Visit (HOSPITAL_COMMUNITY): Payer: Self-pay

## 2023-12-25 ENCOUNTER — Other Ambulatory Visit: Payer: Self-pay

## 2024-04-28 ENCOUNTER — Other Ambulatory Visit (HOSPITAL_COMMUNITY): Payer: Self-pay

## 2024-04-28 ENCOUNTER — Other Ambulatory Visit: Payer: Self-pay
